# Patient Record
Sex: Female | Born: 1935 | Race: White | Hispanic: No | Marital: Married | Smoking: Never smoker
Health system: Southern US, Community
[De-identification: ages and names within clinical notes are randomized; demographics above are authoritative.]

## PROBLEM LIST (undated history)

## (undated) DIAGNOSIS — I639 Cerebral infarction, unspecified: Secondary | ICD-10-CM

## (undated) DIAGNOSIS — F419 Anxiety disorder, unspecified: Secondary | ICD-10-CM

## (undated) DIAGNOSIS — K219 Gastro-esophageal reflux disease without esophagitis: Secondary | ICD-10-CM

## (undated) DIAGNOSIS — I2510 Atherosclerotic heart disease of native coronary artery without angina pectoris: Secondary | ICD-10-CM

## (undated) DIAGNOSIS — M1990 Unspecified osteoarthritis, unspecified site: Secondary | ICD-10-CM

## (undated) DIAGNOSIS — E559 Vitamin D deficiency, unspecified: Secondary | ICD-10-CM

## (undated) DIAGNOSIS — G629 Polyneuropathy, unspecified: Secondary | ICD-10-CM

## (undated) DIAGNOSIS — E785 Hyperlipidemia, unspecified: Secondary | ICD-10-CM

## (undated) DIAGNOSIS — F329 Major depressive disorder, single episode, unspecified: Secondary | ICD-10-CM

## (undated) DIAGNOSIS — M797 Fibromyalgia: Secondary | ICD-10-CM

## (undated) DIAGNOSIS — I10 Essential (primary) hypertension: Secondary | ICD-10-CM

## (undated) DIAGNOSIS — I480 Paroxysmal atrial fibrillation: Secondary | ICD-10-CM

## (undated) DIAGNOSIS — I350 Nonrheumatic aortic (valve) stenosis: Secondary | ICD-10-CM

## (undated) DIAGNOSIS — F32A Depression, unspecified: Secondary | ICD-10-CM

## (undated) DIAGNOSIS — M519 Unspecified thoracic, thoracolumbar and lumbosacral intervertebral disc disorder: Secondary | ICD-10-CM

## (undated) HISTORY — DX: Vitamin D deficiency, unspecified: E55.9

## (undated) HISTORY — PX: ESOPHAGOGASTRODUODENOSCOPY ENDOSCOPY: SHX5814

## (undated) HISTORY — DX: Unspecified osteoarthritis, unspecified site: M19.90

## (undated) HISTORY — PX: CARDIAC CATHETERIZATION: SHX172

## (undated) HISTORY — DX: Hyperlipidemia, unspecified: E78.5

## (undated) HISTORY — DX: Essential (primary) hypertension: I10

## (undated) HISTORY — DX: Fibromyalgia: M79.7

## (undated) HISTORY — DX: Unspecified thoracic, thoracolumbar and lumbosacral intervertebral disc disorder: M51.9

## (undated) HISTORY — PX: CHOLECYSTECTOMY: SHX55

## (undated) HISTORY — PX: COLONOSCOPY: SHX174

## (undated) HISTORY — DX: Depression, unspecified: F32.A

## (undated) HISTORY — DX: Paroxysmal atrial fibrillation: I48.0

## (undated) HISTORY — PX: ABDOMINAL HYSTERECTOMY: SHX81

## (undated) HISTORY — DX: Polyneuropathy, unspecified: G62.9

## (undated) HISTORY — DX: Gastro-esophageal reflux disease without esophagitis: K21.9

## (undated) HISTORY — DX: Major depressive disorder, single episode, unspecified: F32.9

## (undated) HISTORY — PX: JOINT REPLACEMENT: SHX530

---

## 2014-07-28 ENCOUNTER — Non-Acute Institutional Stay (SKILLED_NURSING_FACILITY): Payer: Medicare Other | Admitting: Internal Medicine

## 2014-07-28 DIAGNOSIS — Z96651 Presence of right artificial knee joint: Secondary | ICD-10-CM

## 2014-07-28 DIAGNOSIS — E785 Hyperlipidemia, unspecified: Secondary | ICD-10-CM

## 2014-07-28 DIAGNOSIS — E559 Vitamin D deficiency, unspecified: Secondary | ICD-10-CM

## 2014-07-28 DIAGNOSIS — T8140XA Infection following a procedure, unspecified, initial encounter: Secondary | ICD-10-CM

## 2014-07-28 DIAGNOSIS — G629 Polyneuropathy, unspecified: Secondary | ICD-10-CM

## 2014-07-28 DIAGNOSIS — K219 Gastro-esophageal reflux disease without esophagitis: Secondary | ICD-10-CM

## 2014-07-28 DIAGNOSIS — I10 Essential (primary) hypertension: Secondary | ICD-10-CM

## 2014-07-28 DIAGNOSIS — F329 Major depressive disorder, single episode, unspecified: Secondary | ICD-10-CM

## 2014-07-28 DIAGNOSIS — F32A Depression, unspecified: Secondary | ICD-10-CM

## 2014-07-28 DIAGNOSIS — T814XXA Infection following a procedure, initial encounter: Secondary | ICD-10-CM

## 2014-07-28 NOTE — Progress Notes (Signed)
MRN: [id number] Name: Heather Walters  Sex: female Age: 80 y.o. DOB: 11-Mar-1932  [name] Facility/Room:114 Level Of Care: SNF Provider: Merrilee Seashore D Emergency Contacts: No emergency contact information on file.  Code Status: FULL  Allergies: Codeine and Lipitor  Chief Complaint  Patient presents with  . New Admit To SNF    HPI: Patient is 80 y.o. female who is admitted to SNF for OT/PT s/p R knee arthroplasy.  Past Medical History  Diagnosis Date  . Arthritis   . Fibromyalgia   . Intervertebral disk disease     lumbar  . Hypertension   . Depression   . Neuropathy   . GERD (gastroesophageal reflux disease)   . Hyperlipidemia   . Vitamin D deficiency     Past Surgical History  Procedure Laterality Date  . Cholecystectomy    . Abdominal hysterectomy        Medication List       This list is accurate as of: 07/28/14 11:59 PM.  Always use your most recent med list.               amLODipine 10 MG tablet  Commonly known as:  NORVASC  Take 10 mg by mouth daily.     aspirin 325 MG EC tablet  Take 325 mg by mouth 2 (two) times daily.     carvedilol 6.25 MG tablet  Commonly known as:  COREG  Take 6.25 mg by mouth 2 (two) times daily with a meal.     dicyclomine 10 MG capsule  Commonly known as:  BENTYL  Take 10 mg by mouth 3 (three) times daily before meals.     enalapril-hydrochlorothiazide 10-25 MG per tablet  Commonly known as:  VASERETIC  Take 1 tablet by mouth 2 (two) times daily.     escitalopram 20 MG tablet  Commonly known as:  LEXAPRO  Take 20 mg by mouth daily.     furosemide 40 MG tablet  Commonly known as:  LASIX  Take 40 mg by mouth daily as needed.     gabapentin 400 MG capsule  Commonly known as:  NEURONTIN  Take 400 mg by mouth 3 (three) times daily.     LORazepam 0.5 MG tablet  Commonly known as:  ATIVAN  Take 0.5 mg by mouth 2 (two) times daily.     Magnesium 250 MG Tabs  Take 1 tablet by mouth 2 (two) times  daily.     metoCLOPramide 10 MG tablet  Commonly known as:  REGLAN  Take 10 mg by mouth 2 (two) times daily.     oxyCODONE-acetaminophen 7.5-325 MG per tablet  Commonly known as:  PERCOCET  Take 1 tablet by mouth every 12 (twelve) hours as needed for pain.     pantoprazole 40 MG tablet  Commonly known as:  PROTONIX  Take 40 mg by mouth daily.     promethazine 25 MG tablet  Commonly known as:  PHENERGAN  Take 25 mg by mouth every 6 (six) hours as needed for nausea or vomiting.     rosuvastatin 20 MG tablet  Commonly known as:  CRESTOR  Take 20 mg by mouth daily.     sucralfate 1 G tablet  Commonly known as:  CARAFATE  Take 1 g by mouth 4 (four) times daily -  with meals and at bedtime.     terbinafine 250 MG tablet  Commonly known as:  LAMISIL  Take 250 mg by mouth daily. For 7  days every month     traZODone 50 MG tablet  Commonly known as:  DESYREL  Take 50 mg by mouth at bedtime.     Vitamin D (Ergocalciferol) 50000 UNITS Caps capsule  Commonly known as:  DRISDOL  Take 50,000 Units by mouth every 7 (seven) days.     zolpidem 10 MG tablet  Commonly known as:  AMBIEN  Take 10 mg by mouth at bedtime.        Meds ordered this encounter  Medications  . amLODipine (NORVASC) 10 MG tablet    Sig: Take 10 mg by mouth daily.  . carvedilol (COREG) 6.25 MG tablet    Sig: Take 6.25 mg by mouth 2 (two) times daily with a meal.  . dicyclomine (BENTYL) 10 MG capsule    Sig: Take 10 mg by mouth 3 (three) times daily before meals.  . enalapril-hydrochlorothiazide (VASERETIC) 10-25 MG per tablet    Sig: Take 1 tablet by mouth 2 (two) times daily.  . Vitamin D, Ergocalciferol, (DRISDOL) 50000 UNITS CAPS capsule    Sig: Take 50,000 Units by mouth every 7 (seven) days.  Marland Kitchen. escitalopram (LEXAPRO) 20 MG tablet    Sig: Take 20 mg by mouth daily.  . furosemide (LASIX) 40 MG tablet    Sig: Take 40 mg by mouth daily as needed.  . gabapentin (NEURONTIN) 400 MG capsule    Sig: Take  400 mg by mouth 3 (three) times daily.  Marland Kitchen. LORazepam (ATIVAN) 0.5 MG tablet    Sig: Take 0.5 mg by mouth 2 (two) times daily.  . Magnesium 250 MG TABS    Sig: Take 1 tablet by mouth 2 (two) times daily.  . metoCLOPramide (REGLAN) 10 MG tablet    Sig: Take 10 mg by mouth 2 (two) times daily.  Marland Kitchen. oxyCODONE-acetaminophen (PERCOCET) 7.5-325 MG per tablet    Sig: Take 1 tablet by mouth every 12 (twelve) hours as needed for pain.  . pantoprazole (PROTONIX) 40 MG tablet    Sig: Take 40 mg by mouth daily.  . promethazine (PHENERGAN) 25 MG tablet    Sig: Take 25 mg by mouth every 6 (six) hours as needed for nausea or vomiting.  . rosuvastatin (CRESTOR) 20 MG tablet    Sig: Take 20 mg by mouth daily.  . sucralfate (CARAFATE) 1 G tablet    Sig: Take 1 g by mouth 4 (four) times daily -  with meals and at bedtime.  . terbinafine (LAMISIL) 250 MG tablet    Sig: Take 250 mg by mouth daily. For 7 days every month  . traZODone (DESYREL) 50 MG tablet    Sig: Take 50 mg by mouth at bedtime.  Marland Kitchen. zolpidem (AMBIEN) 10 MG tablet    Sig: Take 10 mg by mouth at bedtime.  Marland Kitchen. aspirin 325 MG EC tablet    Sig: Take 325 mg by mouth 2 (two) times daily.     There is no immunization history on file for this patient.  History  Substance Use Topics  . Smoking status: Never Smoker   . Smokeless tobacco: Never Used  . Alcohol Use: No    Family history is noncontributory    Review of Systems  DATA OBTAINED: from patient GENERAL:  no fevers, fatigue, appetite changes SKIN: No itching, rash or wounds EYES: No eye pain, redness, discharge EARS: No earache, tinnitus, change in hearing NOSE: No congestion, drainage or bleeding  MOUTH/THROAT: No mouth or tooth pain, No sore throat RESPIRATORY: No cough,  wheezing, SOB CARDIAC: No chest pain, palpitations, lower extremity edema  GI: No abdominal pain, No N/V/D or constipation, No heartburn or reflux  GU: No dysuria, frequency or urgency, or incontinence   MUSCULOSKELETAL: pain in knee not worse recently NEUROLOGIC: No headache, dizziness or focal weakness PSYCHIATRIC: No overt anxiety or sadness, No behavior issue.   Filed Vitals:   07/28/14 2211  BP: 144/65  Pulse: 74  Temp: 96.7 F (35.9 C)  Resp: 18    Physical Exam  GENERAL APPEARANCE: Alert, conversant,  No acute distress.  SKIN: mild erythema with heat lateral side to knee incision;no drainage HEAD: Normocephalic, atraumatic  EYES: Conjunctiva/lids clear. Pupils round, reactive. EOMs intact.  EARS: External exam WNL, canals clear. Hearing grossly normal.  NOSE: No deformity or discharge.  MOUTH/THROAT: Lips w/o lesions  RESPIRATORY: Breathing is even, unlabored. Lung sounds are clear   CARDIOVASCULAR: Heart RRR no murmurs, rubs or gallops. Trace peripheral edema on R  GASTROINTESTINAL: Abdomen is soft, non-tender, not distended w/ normal bowel sounds. GENITOURINARY: Bladder non tender, not distended  MUSCULOSKELETAL: R knee arthroplasty with central incision NEUROLOGIC:  Cranial nerves 2-12 grossly intact. Moves all extremities  PSYCHIATRIC: Mood and affect appropriate to situation, no behavioral issues  Patient Active Problem List   Diagnosis Date Noted  . S/P total knee arthroplasty 07/29/2014  . Post-operative infection 07/29/2014  . Hypertension   . Depression   . Neuropathy   . GERD (gastroesophageal reflux disease)   . Hyperlipidemia   . Vitamin D deficiency        Assessment and Plan  S/P total knee arthroplasty For endstage OA; ASA 325 BID as prophylaxis; admitted to SNF for OT/PT  Hypertension Pt on multiple med-cont all  GERD (gastroesophageal reflux disease) Continue protonix,carafate and reglan  Neuropathy Neurontin 400 mg TID to continue  Depression Continue lexapro, ativan, trazodone  Hyperlipidemia Continue crestor 20 mg  Vitamin D deficiency Repletion 50,000 u weekly  Post-operative infection Early cellulitis-start doxycycline  100 mg BID for 7 days    Margit HanksALEXANDER, ANNE D, MD

## 2014-07-29 ENCOUNTER — Encounter: Payer: Self-pay | Admitting: Internal Medicine

## 2014-07-29 DIAGNOSIS — Z96659 Presence of unspecified artificial knee joint: Secondary | ICD-10-CM | POA: Insufficient documentation

## 2014-07-29 DIAGNOSIS — E559 Vitamin D deficiency, unspecified: Secondary | ICD-10-CM | POA: Insufficient documentation

## 2014-07-29 DIAGNOSIS — I10 Essential (primary) hypertension: Secondary | ICD-10-CM | POA: Insufficient documentation

## 2014-07-29 DIAGNOSIS — F32A Depression, unspecified: Secondary | ICD-10-CM | POA: Insufficient documentation

## 2014-07-29 DIAGNOSIS — E785 Hyperlipidemia, unspecified: Secondary | ICD-10-CM | POA: Insufficient documentation

## 2014-07-29 DIAGNOSIS — T8140XA Infection following a procedure, unspecified, initial encounter: Secondary | ICD-10-CM | POA: Insufficient documentation

## 2014-07-29 DIAGNOSIS — G629 Polyneuropathy, unspecified: Secondary | ICD-10-CM | POA: Insufficient documentation

## 2014-07-29 DIAGNOSIS — K219 Gastro-esophageal reflux disease without esophagitis: Secondary | ICD-10-CM | POA: Insufficient documentation

## 2014-07-29 DIAGNOSIS — F329 Major depressive disorder, single episode, unspecified: Secondary | ICD-10-CM | POA: Insufficient documentation

## 2014-07-29 NOTE — Assessment & Plan Note (Signed)
Early cellulitis-start doxycycline 100 mg BID for 7 days

## 2014-07-29 NOTE — Assessment & Plan Note (Signed)
Continue protonix,carafate and reglan

## 2014-07-29 NOTE — Assessment & Plan Note (Signed)
Pt on multiple med-cont all

## 2014-07-29 NOTE — Assessment & Plan Note (Signed)
Neurontin 400 mg TID to continue

## 2014-07-29 NOTE — Assessment & Plan Note (Signed)
For endstage OA; ASA 325 BID as prophylaxis; admitted to SNF for OT/PT

## 2014-07-29 NOTE — Assessment & Plan Note (Signed)
Repletion 50,000 u weekly

## 2014-07-29 NOTE — Assessment & Plan Note (Signed)
Continue crestor 20 mg 

## 2014-07-29 NOTE — Assessment & Plan Note (Signed)
Continue lexapro, ativan, trazodone

## 2014-08-03 ENCOUNTER — Non-Acute Institutional Stay (SKILLED_NURSING_FACILITY): Payer: Medicare Other | Admitting: Internal Medicine

## 2014-08-03 DIAGNOSIS — T814XXA Infection following a procedure, initial encounter: Secondary | ICD-10-CM

## 2014-08-03 DIAGNOSIS — T8140XA Infection following a procedure, unspecified, initial encounter: Secondary | ICD-10-CM

## 2014-08-03 DIAGNOSIS — I10 Essential (primary) hypertension: Secondary | ICD-10-CM

## 2014-08-03 DIAGNOSIS — Z96651 Presence of right artificial knee joint: Secondary | ICD-10-CM

## 2014-08-03 DIAGNOSIS — D72829 Elevated white blood cell count, unspecified: Secondary | ICD-10-CM

## 2014-08-03 NOTE — Progress Notes (Signed)
Patient ID: Heather MiloAthalee Bottcher, female   DOB: 1932/02/16, 80 y.o.   MRN: [id number] MRN: [id number] Name: Heather Walters  Sex: female Age: 80 y.o. DOB: 1932/02/16  [facility] Facility/Room:114 Level Of Care: SNF Provider: Roena MaladyLASSEN, Amandalee Lacap C Emergency Contacts: Extended Emergency Contact Information Primary Emergency Contact: Bembenek,Charles Address: [address]          [place], [place] [id number] [place] Mobile Phone: [phone] Relation: Spouse Secondary Emergency Contact: Kizzie IdeFowlwell,Amy          [place] Mobile Phone: [phone] Relation: Daughter  Code Status: FULL  Allergies: Codeine and Lipitor  Chief Complaint  Patient presents with  . Acute Visit  follow-up right knee infection--leukocytosis--  HPI: Patient is 80 y.o. female who is admitted to SNF for OT/PT s/p R knee arthroplasy.--she appears to be doing relatively well-she was seen by Dr. Lyn HollingsheadAlexander last week who did start her on doxycycline for concerns of a developing surgical site infection of the right knee-she actually was seen by orthopedics earlier today and apparently was thought to be doing well.  She has been afebrile.  I do not she has received updated labs on December 3 which shows a white count of 13.3-hemoglobin of 9.6---platelets of 372.  I do not have any prior labs available for comparison since [facility]-however clinically appears to be stable in this regards.    Past Medical History  Diagnosis Date  . Arthritis   . Fibromyalgia   . Intervertebral disk disease     lumbar  . Hypertension   . Depression   . Neuropathy   . GERD (gastroesophageal reflux disease)   . Hyperlipidemia   . Vitamin D deficiency     Past Surgical History  Procedure Laterality Date  . Cholecystectomy    . Abdominal hysterectomy        Medication List       This list is accurate as of: 08/03/14 11:59 PM.  Always use your most recent med list.                amLODipine 10 MG tablet  Commonly known as:  NORVASC  Take 10 mg by mouth daily.     aspirin 325 MG EC tablet  Take 325 mg by mouth 2 (two) times daily.     carvedilol 6.25 MG tablet  Commonly known as:  COREG  Take 6.25 mg by mouth 2 (two) times daily with a meal.     dicyclomine 10 MG capsule  Commonly known as:  BENTYL  Take 10 mg by mouth 3 (three) times daily before meals.     enalapril-hydrochlorothiazide 10-25 MG per tablet  Commonly known as:  VASERETIC  Take 1 tablet by mouth 2 (two) times daily.     escitalopram 20 MG tablet  Commonly known as:  LEXAPRO  Take 20 mg by mouth daily.     furosemide 40 MG tablet  Commonly known as:  LASIX  Take 40 mg by mouth daily as needed.     gabapentin 400 MG capsule  Commonly known as:  NEURONTIN  Take 400 mg by mouth 3 (three) times daily.     LORazepam 0.5 MG tablet  Commonly known as:  ATIVAN  Take 0.5 mg by mouth 2 (two) times daily.     Magnesium 250 MG Tabs  Take 1 tablet by mouth 2 (two) times daily.     metoCLOPramide 10 MG tablet  Commonly  known as:  REGLAN  Take 10 mg by mouth 2 (two) times daily.     oxyCODONE-acetaminophen 7.5-325 MG per tablet  Commonly known as:  PERCOCET  Take 1 tablet by mouth every 12 (twelve) hours as needed for pain.     pantoprazole 40 MG tablet  Commonly known as:  PROTONIX  Take 40 mg by mouth daily.     promethazine 25 MG tablet  Commonly known as:  PHENERGAN  Take 25 mg by mouth every 6 (six) hours as needed for nausea or vomiting.     rosuvastatin 20 MG tablet  Commonly known as:  CRESTOR  Take 20 mg by mouth daily.     sucralfate 1 G tablet  Commonly known as:  CARAFATE  Take 1 g by mouth 4 (four) times daily -  with meals and at bedtime.     terbinafine 250 MG tablet  Commonly known as:  LAMISIL  Take 250 mg by mouth daily. For 7 days every month     traZODone 50 MG tablet  Commonly known as:  DESYREL  Take 50 mg by mouth at bedtime.     Vitamin  D (Ergocalciferol) 50000 UNITS Caps capsule  Commonly known as:  DRISDOL  Take 50,000 Units by mouth every 7 (seven) days.     zolpidem 10 MG tablet  Commonly known as:  AMBIEN  Take 10 mg by mouth at bedtime.        No orders of the defined types were placed in this encounter.     There is no immunization history on file for this patient.  History  Substance Use Topics  . Smoking status: Never Smoker   . Smokeless tobacco: Never Used  . Alcohol Use: No    Family history is noncontributory    Review of Systems  DATA OBTAINED: from patient GENERAL:  no fevers, fatigue, appetite changes SKIN: No itching, rash or wounds EYES: No eye pain, redness, discharge EARS: No earache, tinnitus, change in hearing NOSE: No congestion, drainage or bleeding  MOUTH/THROAT: No mouth or tooth pain, No sore throat RESPIRATORY: No cough, wheezing, SOB CARDIAC: No chest pain, palpitations, lower extremity edema  GI: No abdominal pain, No N/V/D or constipation, has history of GERD reflux on multiple agents  GU: No dysuria, frequency or urgency, or incontinence  MUSCULOSKELETAL: pain in knee not worse recently--had some concerns of infection of her right knee earlier in her stay but this appears to have resolved fairly unremarkably NEUROLOGIC: No headache, dizziness or focal weakness PSYCHIATRIC: No overt anxiety or sadness, No behavior issue.   Filed Vitals:   08/05/14 1523  BP: 130/74  Pulse: 70  Temp: 98 F (36.7 C)  Resp: 18    Physical Exam  GENERAL APPEARANCE: Alert, conversant,  No acute distress.  SKIN: apparently the erythema lateral to the right knee surgical site has resolved-this does not appear to be remarkable today Steri-Strips are in place--there is no drainage or bleeding HEAD: Normocephalic, atraumatic  EYES: Conjunctiva/lids clear. Pupils round, reactive. EOMs intact.  EARS: External exam WNL, canals clear. Hearing grossly normal.  NOSE: No deformity or discharge.   MOUTH/THROAT: Lips w/o lesions  RESPIRATORY: Breathing is even, unlabored. Lung sounds are clear   CARDIOVASCULAR: Heart RRR no murmurs, rubs or gallops. Trace peripheral edema on R  GASTROINTESTINAL: Abdomen is soft, non-tender, not distended w/ normal bowel sounds. GENITOURINARY: Bladder non tender, not distended  MUSCULOSKELETAL: R knee arthroplasty with central incision  asnoted above NEUROLOGIC:  Cranial nerves 2-12 grossly intact. Moves all extremities  PSYCHIATRIC: Mood and affect appropriate to situation, no behavioral issues  Patient Active Problem List   Diagnosis Date Noted  . S/P total knee arthroplasty 07/29/2014  . Post-operative infection 07/29/2014  . Hypertension   . Depression   . Neuropathy   . GERD (gastroesophageal reflux disease)   . Hyperlipidemia   . Vitamin D deficiency        Assessment and Plan   #1-right knee surgical infection-this appears largely resolved she is finishing a course of doxycycline at this point monitor but do not really note any signs of infection currently--she continues on aspirin for anticoagulation status post surgery-is receiving oxycodone for pain management . #2 leukocytosis--have ordered a CBC for follow-up-do not have basis of comparison from previous lab although this could be reactive from surgery---currently not showing any signs of overt infection does not complain of dysuria shortness of breath or chest congestion or cough--again will see what the updated lab work tells [place].  #3 mild hyponatremia I do note she is on Lasix again this is very minimal will update lab for follow-up.  History hypertension she is on multiple agents-this appears stable recent blood pressures 135/85-130/74-139/79 at this point will monitor.  #4-neuropathy does not complain of neuropathic pain currently she is on Neurontin and continue to monitor  .    No problem-specific assessment & plan notes found for this encounter.   Brooklyn Jeff C,

## 2014-08-05 ENCOUNTER — Encounter: Payer: Self-pay | Admitting: Internal Medicine

## 2014-08-06 ENCOUNTER — Other Ambulatory Visit: Payer: Self-pay | Admitting: *Deleted

## 2014-08-06 MED ORDER — OXYCODONE-ACETAMINOPHEN 7.5-325 MG PO TABS: ORAL_TABLET | ORAL | Status: DC

## 2014-08-06 NOTE — Telephone Encounter (Signed)
[facility] 

## 2014-08-07 ENCOUNTER — Non-Acute Institutional Stay (SKILLED_NURSING_FACILITY): Payer: Medicare Other | Admitting: Internal Medicine

## 2014-08-07 DIAGNOSIS — G629 Polyneuropathy, unspecified: Secondary | ICD-10-CM

## 2014-08-07 DIAGNOSIS — T8140XA Infection following a procedure, unspecified, initial encounter: Secondary | ICD-10-CM

## 2014-08-07 DIAGNOSIS — I10 Essential (primary) hypertension: Secondary | ICD-10-CM

## 2014-08-07 DIAGNOSIS — K219 Gastro-esophageal reflux disease without esophagitis: Secondary | ICD-10-CM

## 2014-08-07 DIAGNOSIS — Z96659 Presence of unspecified artificial knee joint: Secondary | ICD-10-CM

## 2014-08-07 DIAGNOSIS — T814XXA Infection following a procedure, initial encounter: Secondary | ICD-10-CM

## 2014-08-07 DIAGNOSIS — R609 Edema, unspecified: Secondary | ICD-10-CM

## 2014-08-07 NOTE — Progress Notes (Signed)
Patient ID: [name], female   DOB: 03/19/1932, 80 y.o.   MRN: [id number] MRN: [id number] Name: Heather Walters  Sex: female Age: 80 y.o. DOB: 18-Mar-1932  [facility] #: [name] Facility/Room:114 Level Of Care: SNF Provider: Roena Malady Emergency Contacts: Extended Emergency Contact Information Primary Emergency Contact: Matlack,Charles Address: [address]          [place], [place] [address] [place] of [place] Phone: [phone] Relation: Spouse Secondary Emergency Contact: Kizzie Ide, [place] of [place] Phone: [phone] Relation: Daughter  Code Status: FULL  Allergies: Codeine and Lipitor  Chief Complaint  Patient presents with  . Discharge Note    HPI: Patient is 80 y.o. female who is admitted to SNF for OT/PT s/p R knee arthroplasy.--She has been relatively well-although still has significant ambulation issue she does use a rolling walker-she will need continued physical therapy and occupational therapy at home secondary to this weakness as well as nursing support-as well as a rolling walker.  She does have a husband who is very supportive-but will need extensive support nonetheless-she she still is quite weak  She recently completed course of doxycycline for a mild postop infection at the surgical site this appears to have resolved.  She does have a significant history of GERD which occasionally symptomatic she is on Carafate and Reglan as well as a proton pump inhibitor.  Vital signs remainstable she has been afebrile    Past Medical History  Diagnosis Date  . Arthritis   . Fibromyalgia   . Intervertebral disk disease     lumbar  . Hypertension   . Depression   . Neuropathy   . GERD (gastroesophageal reflux disease)   . Hyperlipidemia   . Vitamin D deficiency     Past Surgical History  Procedure Laterality Date  . Cholecystectomy    . Abdominal hysterectomy        Medication List       This list is  accurate as of: 08/07/14 11:59 PM.  Always use your most recent med list.               amLODipine 10 MG tablet  Commonly known as:  NORVASC  Take 10 mg by mouth daily.     aspirin 325 MG EC tablet  Take 325 mg by mouth 2 (two) times daily.     carvedilol 6.25 MG tablet  Commonly known as:  COREG  Take 6.25 mg by mouth 2 (two) times daily with a meal.     dicyclomine 10 MG capsule  Commonly known as:  BENTYL  Take 10 mg by mouth 3 (three) times daily before meals.     enalapril-hydrochlorothiazide 10-25 MG per tablet  Commonly known as:  VASERETIC  Take 1 tablet by mouth 2 (two) times daily.     escitalopram 20 MG tablet  Commonly known as:  LEXAPRO  Take 20 mg by mouth daily.     furosemide 40 MG tablet  Commonly known as:  LASIX  Take 40 mg by mouth daily as needed.     gabapentin 400 MG capsule  Commonly known as:  NEURONTIN  Take 400 mg by mouth 3 (three) times daily.     LORazepam 0.5 MG tablet  Commonly known as:  ATIVAN  Take 0.5 mg by mouth 2 (two) times daily.     Magnesium 250 MG Tabs  Take 1 tablet by mouth 2 (two) times daily.  metoCLOPramide 10 MG tablet  Commonly known as:  REGLAN  Take 10 mg by mouth 2 (two) times daily.     oxyCODONE-acetaminophen 7.5-325 MG per tablet  Commonly known as:  PERCOCET  Take one or two tablets by mouth every 4-6 hours as needed for pain     pantoprazole 40 MG tablet  Commonly known as:  PROTONIX  Take 40 mg by mouth daily.     promethazine 25 MG tablet  Commonly known as:  PHENERGAN  Take 25 mg by mouth every 6 (six) hours as needed for nausea or vomiting.     rosuvastatin 20 MG tablet  Commonly known as:  CRESTOR  Take 20 mg by mouth daily.     sucralfate 1 G tablet  Commonly known as:  CARAFATE  Take 1 g by mouth 4 (four) times daily -  with meals and at bedtime.     terbinafine 250 MG tablet  Commonly known as:  LAMISIL  Take 250 mg by mouth daily. For 7 days every month     traZODone 50 MG  tablet  Commonly known as:  DESYREL  Take 50 mg by mouth at bedtime.     Vitamin D (Ergocalciferol) 50000 UNITS Caps capsule  Commonly known as:  DRISDOL  Take 50,000 Units by mouth every 7 (seven) days.     zolpidem 10 MG tablet  Commonly known as:  AMBIEN  Take 10 mg by mouth at bedtime.        No orders of the defined types were placed in this encounter.     There is no immunization history on file for this patient.  History  Substance Use Topics  . Smoking status: Never Smoker   . Smokeless tobacco: Never Used  . Alcohol Use: No    Family history is noncontributory    Review of Systems  DATA OBTAINED: from patient GENERAL:  no fevers, fatigue, appetite changes--says occasionally she'll feel bit bloated SKIN: No itching, rash or wounds-school site right knee appears improved status post antibiotic for infection EYES: No eye pain, redness, discharge EARS: No earache, tinnitus, change in hearing NOSE: No congestion, drainage or bleeding  MOUTH/THROAT: No mouth or tooth pain, No sore throat RESPIRATORY: No cough, wheezing, SOB CARDIAC: No chest pain, palpitations  has some mildly increased, lower extremity edema  GI: No abdominal pain, No N/V/D or constipation, as reflux occasionally symptomatic GU: No dysuria, frequency or urgency, or incontinence  MUSCULOSKELETAL: pain in knee not worse recently NEUROLOGIC: No headache, dizziness or focal weakness PSYCHIATRIC: No overt anxiety or sadness, No behavior issue.   Filed Vitals:   08/09/14 2202  BP: 135/65  Pulse: 70  Temp: 96.2 F (35.7 C)  Resp: 20    Physical Exam  GENERAL APPEARANCE: Alert, conversant,  No acute distress.  SKIN: Warm and dry s  right knee surgical site appears stable I do not see any sig--nificant concerning erythema this is improved I do not see sign of infection HEAD: Normocephalic, atraumatic  EYES: Conjunctiva/lids clear. Pupils round, reactive. EOMs intact.  EARS: External exam WNL,  canals clear. Hearing grossly normal.  NOSE: No deformity or discharge.  MOUTH/THROAT: Lips w/o lesions  RESPIRATORY: Breathing is even, unlabored. Lung sounds are clear   CARDIOVASCULAR: Heart RRR no murmurs, rubs or gallops. Trace left lower extremity peripheral edema --1+ edema on the right this is cool nonerythematous nontender with palpable pedal pulse although somewhat reduced  GASTROINTESTINAL: Abdomen is soft, non-tender, obese w/ normal bowel  sounds. GENITOURINARY: Bladder non tender, not distended  MUSCULOSKELETAL: R knee arthroplasty with central incision again site appears improved--she is able to ambulate with a rolling walker but has significant weakness when trying to rise from a seated position NEUROLOGIC:  Cranial nerves 2-12 grossly intact. Moves all extremities  PSYCHIATRIC: Mood and affect appropriate to situation, no behavioral issues  Patient Active Problem List   Diagnosis Date Noted  . S/P total knee arthroplasty 07/29/2014  . Post-operative infection 07/29/2014  . Hypertension   . Depression   . Neuropathy   . GERD (gastroesophageal reflux disease)   . Hyperlipidemia   . Vitamin D deficiency    labs.  08/03/2014.  Sodium 139 potassium 5 BUN 42 creatinine 0.9.  Abdomen phosphatase 131 otherwise liver function tests within normal limits  WBC 11.0 this is actually an improvement-hemoglobin 9.5-platelets 374     Assessment and Plan  #1-history of status post right knee replacement-at this point appears stable she has seen orthopedics-postop infection appears resolved-she is receiving Percocet as needed for pain-continues DVT prophylaxis with aspirin 325 twice a day-she will need extensive PT and OT secondary to continued weakness as well as nursing support.  #2-hypertension this appears stable on numerous agents including Coreg and enalapril hydrochlorothiazide---  #3-edema-this has increased somewhat she says when this happened she usually takes when  necessary Lasix-she says this is usually a accompanied by bloating feeling that she has at times-will give her Lasix 40 mg one time dose-also obtain a chest x-ray tomorrow before discharge to keep an eye on this clinically she does not appear to be unstable certainly  #4-GERD-she is on Carafate Reglan and Protonix this continues or occasionally be symptomatic but appears relatively controlled-defer follow-up to primary care provider.  #5-neuropathy she continues on Neurontin.  #6-history of hyperlipidemia she continues on Crestor.  #7-history depression-this appears relatively stable on Lexapro.  #8-history of anxiety she continues on trazodone-for insomnia she continues on Ambien.  At this point patient continues to be weak again will need extensive therapy-again there are some insurance issues here which have expediated the discharge-nonetheless she does have a very supportive husband--and will have home health which certainly is necessary-also will need a rolling walker.  [id number]-of note greater than 30 minutes spent on this discharge summary scripts have been written  No problem-specific assessment & plan notes found for this encounter.   Daryl Quiros C,

## 2014-08-09 ENCOUNTER — Encounter: Payer: Self-pay | Admitting: Internal Medicine

## 2016-08-28 DIAGNOSIS — I350 Nonrheumatic aortic (valve) stenosis: Secondary | ICD-10-CM

## 2016-08-28 HISTORY — DX: Nonrheumatic aortic (valve) stenosis: I35.0

## 2016-08-30 ENCOUNTER — Inpatient Hospital Stay (HOSPITAL_COMMUNITY)
Admission: EM | Admit: 2016-08-30 | Discharge: 2016-09-02 | DRG: 291 | Disposition: A | Discharge status: Home Health | Payer: Medicare HMO | Attending: Internal Medicine | Admitting: Internal Medicine

## 2016-08-30 ENCOUNTER — Ambulatory Visit (HOSPITAL_COMMUNITY)
Admit: 2016-08-30 | Discharge: 2016-08-30 | Disposition: A | Discharge status: Home / Self Care | Payer: Medicare HMO | Attending: Internal Medicine | Admitting: Internal Medicine

## 2016-08-30 ENCOUNTER — Emergency Department (HOSPITAL_COMMUNITY): Payer: Medicare HMO

## 2016-08-30 ENCOUNTER — Encounter (HOSPITAL_COMMUNITY): Payer: Self-pay | Admitting: Emergency Medicine

## 2016-08-30 DIAGNOSIS — I509 Heart failure, unspecified: Secondary | ICD-10-CM | POA: Diagnosis not present

## 2016-08-30 DIAGNOSIS — E785 Hyperlipidemia, unspecified: Secondary | ICD-10-CM | POA: Diagnosis present

## 2016-08-30 DIAGNOSIS — M797 Fibromyalgia: Secondary | ICD-10-CM | POA: Diagnosis present

## 2016-08-30 DIAGNOSIS — Z8701 Personal history of pneumonia (recurrent): Secondary | ICD-10-CM | POA: Diagnosis not present

## 2016-08-30 DIAGNOSIS — Z8673 Personal history of transient ischemic attack (TIA), and cerebral infarction without residual deficits: Secondary | ICD-10-CM | POA: Diagnosis not present

## 2016-08-30 DIAGNOSIS — J189 Pneumonia, unspecified organism: Secondary | ICD-10-CM | POA: Diagnosis present

## 2016-08-30 DIAGNOSIS — I10 Essential (primary) hypertension: Secondary | ICD-10-CM | POA: Diagnosis not present

## 2016-08-30 DIAGNOSIS — R509 Fever, unspecified: Secondary | ICD-10-CM

## 2016-08-30 DIAGNOSIS — G8929 Other chronic pain: Secondary | ICD-10-CM | POA: Diagnosis present

## 2016-08-30 DIAGNOSIS — K219 Gastro-esophageal reflux disease without esophagitis: Secondary | ICD-10-CM | POA: Diagnosis present

## 2016-08-30 DIAGNOSIS — K649 Unspecified hemorrhoids: Secondary | ICD-10-CM | POA: Diagnosis present

## 2016-08-30 DIAGNOSIS — F419 Anxiety disorder, unspecified: Secondary | ICD-10-CM | POA: Diagnosis present

## 2016-08-30 DIAGNOSIS — Z7982 Long term (current) use of aspirin: Secondary | ICD-10-CM | POA: Diagnosis not present

## 2016-08-30 DIAGNOSIS — R5381 Other malaise: Secondary | ICD-10-CM

## 2016-08-30 DIAGNOSIS — I5033 Acute on chronic diastolic (congestive) heart failure: Secondary | ICD-10-CM | POA: Diagnosis present

## 2016-08-30 DIAGNOSIS — Z90710 Acquired absence of both cervix and uterus: Secondary | ICD-10-CM | POA: Diagnosis not present

## 2016-08-30 DIAGNOSIS — M79609 Pain in unspecified limb: Secondary | ICD-10-CM

## 2016-08-30 DIAGNOSIS — R0902 Hypoxemia: Secondary | ICD-10-CM | POA: Diagnosis present

## 2016-08-30 DIAGNOSIS — R531 Weakness: Secondary | ICD-10-CM | POA: Diagnosis present

## 2016-08-30 DIAGNOSIS — K921 Melena: Secondary | ICD-10-CM | POA: Diagnosis not present

## 2016-08-30 DIAGNOSIS — I110 Hypertensive heart disease with heart failure: Principal | ICD-10-CM | POA: Diagnosis present

## 2016-08-30 DIAGNOSIS — R609 Edema, unspecified: Secondary | ICD-10-CM | POA: Diagnosis not present

## 2016-08-30 DIAGNOSIS — R5383 Other fatigue: Secondary | ICD-10-CM

## 2016-08-30 HISTORY — DX: Anxiety disorder, unspecified: F41.9

## 2016-08-30 HISTORY — DX: Nonrheumatic aortic (valve) stenosis: I35.0

## 2016-08-30 HISTORY — DX: Cerebral infarction, unspecified: I63.9

## 2016-08-30 LAB — COMPREHENSIVE METABOLIC PANEL
ALK PHOS: 80 U/L (ref 38–126)
ALT: 14 U/L (ref 14–54)
AST: 18 U/L (ref 15–41)
Albumin: 3.1 g/dL — ABNORMAL LOW (ref 3.5–5.0)
Anion gap: 10 (ref 5–15)
BILIRUBIN TOTAL: 0.4 mg/dL (ref 0.3–1.2)
BUN: 30 mg/dL — AB (ref 6–20)
CALCIUM: 9.4 mg/dL (ref 8.9–10.3)
CHLORIDE: 97 mmol/L — AB (ref 101–111)
CO2: 27 mmol/L (ref 22–32)
CREATININE: 1.04 mg/dL — AB (ref 0.44–1.00)
GFR calc Af Amer: 56 mL/min — ABNORMAL LOW (ref >60)
GFR, EST NON AFRICAN AMERICAN: 49 mL/min — AB (ref >60)
Glucose, Bld: 118 mg/dL — ABNORMAL HIGH (ref 65–99)
Potassium: 4 mmol/L (ref 3.5–5.1)
Sodium: 134 mmol/L — ABNORMAL LOW (ref 135–145)
TOTAL PROTEIN: 5.9 g/dL — AB (ref 6.5–8.1)

## 2016-08-30 LAB — CBC WITH DIFFERENTIAL/PLATELET
BASOS ABS: 0 10*3/uL (ref 0.0–0.1)
Basophils Relative: 0 %
Eosinophils Absolute: 0 10*3/uL (ref 0.0–0.7)
Eosinophils Relative: 0 %
HEMATOCRIT: 33.4 % — AB (ref 36.0–46.0)
HEMOGLOBIN: 11 g/dL — AB (ref 12.0–15.0)
LYMPHS ABS: 2.8 10*3/uL (ref 0.7–4.0)
LYMPHS PCT: 12 %
MCH: 30.9 pg (ref 26.0–34.0)
MCHC: 32.9 g/dL (ref 30.0–36.0)
MCV: 93.8 fL (ref 78.0–100.0)
Monocytes Absolute: 1.2 10*3/uL — ABNORMAL HIGH (ref 0.1–1.0)
Monocytes Relative: 5 %
NEUTROS ABS: 19.7 10*3/uL — AB (ref 1.7–7.7)
Neutrophils Relative %: 83 %
Platelets: 228 10*3/uL (ref 150–400)
RBC: 3.56 MIL/uL — AB (ref 3.87–5.11)
RDW: 13.2 % (ref 11.5–15.5)
WBC: 23.8 10*3/uL — AB (ref 4.0–10.5)

## 2016-08-30 LAB — URINALYSIS, ROUTINE W REFLEX MICROSCOPIC
Bilirubin Urine: NEGATIVE
GLUCOSE, UA: NEGATIVE mg/dL
HGB URINE DIPSTICK: NEGATIVE
KETONES UR: NEGATIVE mg/dL
LEUKOCYTES UA: NEGATIVE
Nitrite: NEGATIVE
PH: 5 (ref 5.0–8.0)
Protein, ur: NEGATIVE mg/dL
Specific Gravity, Urine: 1.011 (ref 1.005–1.030)

## 2016-08-30 LAB — BRAIN NATRIURETIC PEPTIDE: B Natriuretic Peptide: 191.8 pg/mL — ABNORMAL HIGH (ref 0.0–100.0)

## 2016-08-30 LAB — INFLUENZA PANEL BY PCR (TYPE A & B)
INFLAPCR: NEGATIVE
INFLBPCR: NEGATIVE

## 2016-08-30 LAB — TROPONIN I
Troponin I: 0.03 ng/mL (ref <0.03)
Troponin I: 0.03 ng/mL (ref <0.03)
Troponin I: 0.03 ng/mL (ref <0.03)

## 2016-08-30 LAB — LACTIC ACID, PLASMA
Lactic Acid, Venous: 1.1 mmol/L (ref 0.5–1.9)
Lactic Acid, Venous: 1.1 mmol/L (ref 0.5–1.9)

## 2016-08-30 LAB — TSH: TSH: 1.323 u[IU]/mL (ref 0.350–4.500)

## 2016-08-30 LAB — MRSA PCR SCREENING: MRSA BY PCR: NEGATIVE

## 2016-08-30 LAB — PROCALCITONIN: PROCALCITONIN: 3.51 ng/mL

## 2016-08-30 MED ORDER — SODIUM CHLORIDE 0.9% FLUSH
3.0000 mL | Freq: Two times a day (BID) | INTRAVENOUS | Status: DC
  Administered 2016-08-30 – 2016-09-01 (×5): 3 mL via INTRAVENOUS

## 2016-08-30 MED ORDER — ISOSORBIDE MONONITRATE ER 30 MG PO TB24
60.0000 mg | ORAL_TABLET | Freq: Every day | ORAL | Status: DC
  Administered 2016-08-30 – 2016-09-02 (×4): 60 mg via ORAL
  Filled 2016-08-30 (×3): qty 2
  Filled 2016-08-30: qty 1

## 2016-08-30 MED ORDER — ENALAPRIL MALEATE 10 MG PO TABS
10.0000 mg | ORAL_TABLET | Freq: Every day | ORAL | Status: DC
  Administered 2016-08-30 – 2016-08-31 (×2): 10 mg via ORAL
  Filled 2016-08-30 (×3): qty 1

## 2016-08-30 MED ORDER — ACETAMINOPHEN 500 MG PO TABS: 1000.0000 mg | ORAL_TABLET | Freq: Four times a day (QID) | ORAL | Status: DC | PRN

## 2016-08-30 MED ORDER — SODIUM CHLORIDE 0.9 % IV BOLUS (SEPSIS)
500.0000 mL | Freq: Once | INTRAVENOUS | Status: AC
  Administered 2016-08-30: 500 mL via INTRAVENOUS

## 2016-08-30 MED ORDER — OXYBUTYNIN CHLORIDE ER 5 MG PO TB24
10.0000 mg | ORAL_TABLET | Freq: Every day | ORAL | Status: DC
  Administered 2016-08-30 – 2016-09-01 (×3): 10 mg via ORAL
  Filled 2016-08-30 (×3): qty 2

## 2016-08-30 MED ORDER — DEXTROSE 5 % IV SOLN
1.0000 g | Freq: Two times a day (BID) | INTRAVENOUS | Status: DC
  Administered 2016-08-30 – 2016-09-02 (×6): 1 g via INTRAVENOUS
  Filled 2016-08-30 (×6): qty 1

## 2016-08-30 MED ORDER — SODIUM CHLORIDE 0.9 % IV SOLN: 250.0000 mL | INTRAVENOUS | Status: DC | PRN

## 2016-08-30 MED ORDER — TRAZODONE HCL 50 MG PO TABS
50.0000 mg | ORAL_TABLET | Freq: Every day | ORAL | Status: DC
  Administered 2016-08-30 – 2016-09-01 (×3): 50 mg via ORAL
  Filled 2016-08-30 (×3): qty 1

## 2016-08-30 MED ORDER — ACETAMINOPHEN 325 MG PO TABS
650.0000 mg | ORAL_TABLET | ORAL | Status: DC | PRN
  Administered 2016-09-01 – 2016-09-02 (×2): 650 mg via ORAL
  Filled 2016-08-30 (×3): qty 2

## 2016-08-30 MED ORDER — ONDANSETRON HCL 4 MG/2ML IJ SOLN
4.0000 mg | Freq: Four times a day (QID) | INTRAMUSCULAR | Status: DC | PRN
  Administered 2016-08-31: 4 mg via INTRAVENOUS
  Filled 2016-08-30: qty 2

## 2016-08-30 MED ORDER — VANCOMYCIN HCL IN DEXTROSE 750-5 MG/150ML-% IV SOLN
750.0000 mg | Freq: Two times a day (BID) | INTRAVENOUS | Status: DC
  Administered 2016-08-30 – 2016-08-31 (×3): 750 mg via INTRAVENOUS
  Filled 2016-08-30 (×4): qty 150

## 2016-08-30 MED ORDER — PENTAZOCINE-NALOXONE HCL 50-0.5 MG PO TABS
1.0000 | ORAL_TABLET | Freq: Four times a day (QID) | ORAL | Status: DC | PRN
  Administered 2016-08-31 – 2016-09-02 (×3): 1 via ORAL
  Filled 2016-08-30 (×3): qty 1

## 2016-08-30 MED ORDER — ESCITALOPRAM OXALATE 20 MG PO TABS
20.0000 mg | ORAL_TABLET | Freq: Every day | ORAL | Status: DC
  Administered 2016-08-30 – 2016-09-02 (×4): 20 mg via ORAL
  Filled 2016-08-30: qty 2
  Filled 2016-08-30 (×3): qty 1

## 2016-08-30 MED ORDER — SODIUM CHLORIDE 0.9% FLUSH: 3.0000 mL | INTRAVENOUS | Status: DC | PRN

## 2016-08-30 MED ORDER — ZOLPIDEM TARTRATE 5 MG PO TABS
5.0000 mg | ORAL_TABLET | Freq: Every evening | ORAL | Status: DC | PRN
  Administered 2016-08-30 – 2016-09-01 (×3): 5 mg via ORAL
  Filled 2016-08-30 (×3): qty 1

## 2016-08-30 MED ORDER — PANTOPRAZOLE SODIUM 40 MG PO TBEC
80.0000 mg | DELAYED_RELEASE_TABLET | Freq: Every day | ORAL | Status: DC
Start: 2016-08-30 — End: 2016-09-02
  Administered 2016-08-30 – 2016-09-02 (×4): 80 mg via ORAL
  Filled 2016-08-30 (×4): qty 2

## 2016-08-30 MED ORDER — DEXTROSE 5 % IV SOLN
1.0000 g | Freq: Once | INTRAVENOUS | Status: AC
  Administered 2016-08-30: 1 g via INTRAVENOUS
  Filled 2016-08-30: qty 1

## 2016-08-30 MED ORDER — BIOTIN 2.5 MG PO CAPS: 2500.0000 mg | ORAL_CAPSULE | Freq: Every day | ORAL | Status: DC

## 2016-08-30 MED ORDER — FERROUS SULFATE 325 (65 FE) MG PO TABS
325.0000 mg | ORAL_TABLET | Freq: Every day | ORAL | Status: DC
  Administered 2016-08-31 – 2016-09-02 (×3): 325 mg via ORAL
  Filled 2016-08-30 (×3): qty 1

## 2016-08-30 MED ORDER — ROPINIROLE HCL 0.5 MG PO TABS
0.5000 mg | ORAL_TABLET | Freq: Every day | ORAL | Status: DC
  Administered 2016-08-30 – 2016-09-01 (×3): 0.5 mg via ORAL
  Filled 2016-08-30 (×3): qty 1

## 2016-08-30 MED ORDER — ASPIRIN 81 MG PO CHEW
81.0000 mg | CHEWABLE_TABLET | Freq: Every day | ORAL | Status: DC
  Administered 2016-08-30 – 2016-09-02 (×4): 81 mg via ORAL
  Filled 2016-08-30 (×4): qty 1

## 2016-08-30 MED ORDER — FUROSEMIDE 10 MG/ML IJ SOLN
40.0000 mg | Freq: Two times a day (BID) | INTRAMUSCULAR | Status: DC
  Administered 2016-08-30 – 2016-08-31 (×2): 40 mg via INTRAVENOUS
  Filled 2016-08-30 (×2): qty 4

## 2016-08-30 MED ORDER — SUCRALFATE 1 G PO TABS
1.0000 g | ORAL_TABLET | Freq: Every day | ORAL | Status: DC | PRN
  Administered 2016-08-31 – 2016-09-01 (×2): 1 g via ORAL
  Filled 2016-08-30 (×2): qty 1

## 2016-08-30 MED ORDER — TRAMADOL HCL 50 MG PO TABS
50.0000 mg | ORAL_TABLET | Freq: Four times a day (QID) | ORAL | Status: DC | PRN
  Administered 2016-09-01 (×2): 50 mg via ORAL
  Filled 2016-08-30 (×2): qty 1

## 2016-08-30 MED ORDER — ADULT MULTIVITAMIN W/MINERALS CH
1.0000 | ORAL_TABLET | Freq: Every day | ORAL | Status: DC
  Administered 2016-08-30 – 2016-09-02 (×4): 1 via ORAL
  Filled 2016-08-30 (×4): qty 1

## 2016-08-30 MED ORDER — GABAPENTIN 400 MG PO CAPS
400.0000 mg | ORAL_CAPSULE | Freq: Three times a day (TID) | ORAL | Status: DC | PRN
  Administered 2016-08-30 – 2016-08-31 (×2): 400 mg via ORAL
  Filled 2016-08-30 (×2): qty 1

## 2016-08-30 MED ORDER — LORAZEPAM 0.5 MG PO TABS
0.5000 mg | ORAL_TABLET | Freq: Two times a day (BID) | ORAL | Status: DC
  Administered 2016-08-30 – 2016-09-02 (×5): 0.5 mg via ORAL
  Filled 2016-08-30 (×6): qty 1

## 2016-08-30 MED ORDER — ROSUVASTATIN CALCIUM 20 MG PO TABS
20.0000 mg | ORAL_TABLET | Freq: Every day | ORAL | Status: DC
  Administered 2016-08-30 – 2016-09-01 (×3): 20 mg via ORAL
  Filled 2016-08-30 (×4): qty 1

## 2016-08-30 MED ORDER — CARVEDILOL 6.25 MG PO TABS
6.2500 mg | ORAL_TABLET | Freq: Two times a day (BID) | ORAL | Status: DC
  Administered 2016-08-30 – 2016-09-02 (×6): 6.25 mg via ORAL
  Filled 2016-08-30 (×7): qty 1

## 2016-08-30 MED ORDER — HYDROCORTISONE 2.5 % RE CREA
TOPICAL_CREAM | Freq: Two times a day (BID) | RECTAL | Status: DC | PRN
  Filled 2016-08-30: qty 28.35

## 2016-08-30 MED ORDER — VANCOMYCIN HCL IN DEXTROSE 1-5 GM/200ML-% IV SOLN
1000.0000 mg | INTRAVENOUS | Status: AC
  Administered 2016-08-30: 1000 mg via INTRAVENOUS
  Filled 2016-08-30: qty 200

## 2016-08-30 NOTE — ED Provider Notes (Signed)
[facility]-EMERGENCY DEPT Provider Note   CSN: [id number] Arrival date & time: 08/30/16  [id number]     History   Chief Complaint Chief Complaint  Patient presents with  . Fever    HPI Heather Walters is a [age] y.o. female.  Patient presents with family with concern for fever, SOB, weakness, nausea and vomiting. She became profoundly weak last night and husband reports she couldn't hold herself up to go to the bathroom. No syncope. She was recently treated for colitis in early December with symptoms of bloody diarrhea, as well as inpatient hospitalization for pneumonia, with discharge to rehab for a total of 16 days. She reports feeling better when she went home just before Christmas and became sick again over the last 1-2 days. She reports colonscopies in the past with history of polyps.   The history is provided by the patient. No language interpreter was used.  Fever   Associated symptoms include vomiting. Pertinent negatives include no chest pain and no headaches.    Past Medical History:  Diagnosis Date  . Arthritis   . Depression   . Fibromyalgia   . GERD (gastroesophageal reflux disease)   . Hyperlipidemia   . Hypertension   . Intervertebral disk disease    lumbar  . Neuropathy (HCC)   . Vitamin D deficiency     Patient Active Problem List   Diagnosis Date Noted  . S/P total knee arthroplasty 07/29/2014  . Post-operative infection 07/29/2014  . Hypertension   . Depression   . Neuropathy (HCC)   . GERD (gastroesophageal reflux disease)   . Hyperlipidemia   . Vitamin D deficiency     Past Surgical History:  Procedure Laterality Date  . ABDOMINAL HYSTERECTOMY    . CHOLECYSTECTOMY      OB History    No data available       Home Medications    Prior to Admission medications   Medication Sig Start Date End Date Taking? Authorizing Provider  acetaminophen (TYLENOL) 500 MG tablet Take 1,000 mg by mouth every 6 (six) hours as needed for fever.   Yes Historical  Provider, MD  amLODipine (NORVASC) 10 MG tablet Take 10 mg by mouth daily.   Yes Historical Provider, MD  aspirin 81 MG chewable tablet Chew 81 mg by mouth daily.   Yes Historical Provider, MD  BIOTIN PO Take 2,500 mg by mouth daily.   Yes Historical Provider, MD  carvedilol (COREG) 6.25 MG tablet Take 6.25 mg by mouth 2 (two) times daily with a meal.   Yes Historical Provider, MD  enalapril-hydrochlorothiazide (VASERETIC) 10-25 MG per tablet Take 1 tablet by mouth daily.    Yes Historical Provider, MD  escitalopram (LEXAPRO) 20 MG tablet Take 20 mg by mouth daily.   Yes Historical Provider, MD  ferrous sulfate (FEOSOL) 325 (65 FE) MG tablet Take 325 mg by mouth daily with breakfast.   Yes Historical Provider, MD  furosemide (LASIX) 40 MG tablet Take 40 mg by mouth daily as needed for fluid.    Yes Historical Provider, MD  gabapentin (NEURONTIN) 400 MG capsule Take 400 mg by mouth 3 (three) times daily as needed (nerve pain).    Yes Historical Provider, MD  Glucosamine-Chondroit-Vit C-Mn (GLUCOSAMINE 1500 COMPLEX PO) Take 1 tablet by mouth 2 (two) times daily.   Yes Historical Provider, MD  hydrALAZINE (APRESOLINE) 100 MG tablet Take 100 mg by mouth 3 (three) times daily.   Yes Historical Provider, MD  hydrochlorothiazide (HYDRODIURIL) 25 MG tablet Take  25 mg by mouth daily.   Yes Historical Provider, MD  isosorbide mononitrate (IMDUR) 60 MG 24 hr tablet Take 60 mg by mouth daily.   Yes Historical Provider, MD  LORazepam (ATIVAN) 0.5 MG tablet Take 0.5 mg by mouth 2 (two) times daily.   Yes Historical Provider, MD  Multiple Vitamin (MULTIVITAMIN WITH MINERALS) TABS tablet Take 1 tablet by mouth daily.   Yes Historical Provider, MD  nitroGLYCERIN (NITROSTAT) 0.4 MG SL tablet Place 0.4 mg under the tongue every 5 (five) minutes as needed for chest pain.   Yes Historical Provider, MD  nystatin (MYCOSTATIN) 100000 UNIT/ML suspension Take 5 mLs by mouth 4 (four) times daily.   Yes Historical Provider, MD   nystatin (NYSTATIN) powder Apply topically 2 (two) times daily.   Yes Historical Provider, MD  omeprazole (PRILOSEC) 40 MG capsule Take 40 mg by mouth daily.   Yes Historical Provider, MD  oxybutynin (DITROPAN-XL) 10 MG 24 hr tablet Take 10 mg by mouth at bedtime.   Yes Historical Provider, MD  pentazocine-naloxone (TALWIN NX) 50-0.5 MG tablet Take 1 tablet by mouth every 6 (six) hours as needed for moderate pain.   Yes Historical Provider, MD  rOPINIRole (REQUIP) 0.5 MG tablet Take 0.5 mg by mouth at bedtime.   Yes Historical Provider, MD  rosuvastatin (CRESTOR) 20 MG tablet Take 20 mg by mouth daily.   Yes Historical Provider, MD  sucralfate (CARAFATE) 1 G tablet Take 1 g by mouth daily as needed (as directed).    Yes Historical Provider, MD  traMADol (ULTRAM) 50 MG tablet Take 50 mg by mouth every 6 (six) hours as needed for severe pain.   Yes Historical Provider, MD  traZODone (DESYREL) 50 MG tablet Take 50 mg by mouth at bedtime.   Yes Historical Provider, MD  Vitamin D, Ergocalciferol, (DRISDOL) 50000 UNITS CAPS capsule Take 50,000 Units by mouth every 7 (seven) days.   Yes Historical Provider, MD  zolpidem (AMBIEN) 10 MG tablet Take 10 mg by mouth at bedtime.   Yes Historical Provider, MD  dicyclomine (BENTYL) 10 MG capsule Take 10 mg by mouth 3 (three) times daily before meals.    Historical Provider, MD  metoCLOPramide (REGLAN) 10 MG tablet Take 10 mg by mouth 2 (two) times daily.    Historical Provider, MD  oxyCODONE-acetaminophen (PERCOCET) 7.5-325 MG per tablet Take one or two tablets by mouth every 4-6 hours as needed for pain 08/06/14   Tiffany L Reed, DO  promethazine (PHENERGAN) 25 MG tablet Take 25 mg by mouth every 6 (six) hours as needed for nausea or vomiting.    Historical Provider, MD  terbinafine (LAMISIL) 250 MG tablet Take 250 mg by mouth daily. For 7 days every month    Historical Provider, MD    Family History No family history on file.  Social History Social  History  Substance Use Topics  . Smoking status: Never Smoker  . Smokeless tobacco: Never Used  . Alcohol use No     Allergies   Codeine and Lipitor [atorvastatin]   Review of Systems Review of Systems  Constitutional: Positive for fever. Negative for chills.  HENT: Negative.   Respiratory: Positive for shortness of breath.   Cardiovascular: Positive for leg swelling. Negative for chest pain.  Gastrointestinal: Positive for abdominal pain, blood in stool, nausea and vomiting.  Genitourinary: Negative.  Negative for dysuria.  Musculoskeletal: Positive for myalgias.  Skin: Negative.  Negative for rash.  Neurological: Positive for weakness. Negative for headaches.  Psychiatric/Behavioral: Negative for confusion.     Physical Exam Updated Vital Signs BP 137/60   Pulse 70   Temp 100.8 F (38.2 C) (Rectal)   Resp 17   Ht 5' (1.524 m)   Wt 98.9 kg   SpO2 95%   BMI 42.58 kg/m   Physical Exam  Constitutional: She is oriented to person, place, and time. She appears well-developed and well-nourished.  HENT:  Head: Normocephalic.  Neck: Normal range of motion. Neck supple.  Cardiovascular: Normal rate and regular rhythm.   No murmur heard. Pulmonary/Chest: Effort normal. No respiratory distress. She has rales (Bilateral bases).  Abdominal: Soft. Bowel sounds are normal. There is no tenderness. There is no rebound and no guarding.  Musculoskeletal: Normal range of motion. She exhibits edema (2+ pitting edema bilaterally).  Neurological: She is alert and oriented to person, place, and time.  Skin: Skin is warm and dry. No rash noted.  Psychiatric: She has a normal mood and affect.     ED Treatments / Results  Labs (all labs ordered are listed, but only abnormal results are displayed) Labs Reviewed  CBC WITH DIFFERENTIAL/PLATELET - Abnormal; Notable for the following:       Result Value   WBC 23.8 (*)    RBC 3.56 (*)    Hemoglobin 11.0 (*)    HCT 33.4 (*)    Neutro  Abs 19.7 (*)    Monocytes Absolute 1.2 (*)    All other components within normal limits  URINE CULTURE  CULTURE, BLOOD (ROUTINE X 2)  CULTURE, BLOOD (ROUTINE X 2)  COMPREHENSIVE METABOLIC PANEL  LACTIC ACID, PLASMA  LACTIC ACID, PLASMA  URINALYSIS, ROUTINE W REFLEX MICROSCOPIC  TROPONIN I  BRAIN NATRIURETIC PEPTIDE  INFLUENZA PANEL BY PCR (TYPE A & B, H1N1)    EKG  EKG Interpretation None       Radiology Dg Chest 2 View  Result Date: 08/30/2016 CLINICAL DATA:  Fever, hypoxia this morning.  Recent pneumonia. EXAM: CHEST  2 VIEW COMPARISON:  Chest radiograph August 14, 2016 FINDINGS: Cardiac silhouette is moderately enlarged unchanged. Mediastinal silhouette is nonsuspicious. Similar pulmonary vascular congestion without pleural effusion or focal consolidation. No pneumothorax. Osteopenia. Mild degenerative change of thoracic and lumbar spine. IMPRESSION: Stable cardiomegaly and pulmonary vascular congestion. Electronically Signed   By: Awilda Metro M.D.   On: 08/30/2016 05:36    Procedures Procedures (including critical care time)  Medications Ordered in ED Medications  ceFEPIme (MAXIPIME) 1 g in dextrose 5 % 50 mL IVPB (1 g Intravenous New Bag/Given 08/30/16 0557)  vancomycin (VANCOCIN) IVPB 1000 mg/200 mL premix (not administered)  sodium chloride 0.9 % bolus 500 mL (500 mLs Intravenous New Bag/Given 08/30/16 0557)     Initial Impression / Assessment and Plan / ED Course  I have reviewed the triage vital signs and the nursing notes.  Pertinent labs & imaging results that were available during my care of the patient were reviewed by me and considered in my medical decision making (see chart for details).  Clinical Course     Patient presents with generalized weakness, SOB, N, V and fever x 2 days. History of CHF, recent PNA, recent colitis with rectal bleeding. CXR supports vascular congestion without visualized pneumonia.   She is awake and alert with full  orientation. WBC's over 23K. Remainder of labs pending. Patient care assumed by Dr. Criss Alvine with anticipated admission.  Final Clinical Impressions(s) / ED Diagnoses   Final diagnoses:  None   1. Febrile illness  2. CHF  New Prescriptions New Prescriptions   No medications on file     Elpidio Anis, PA-C 08/30/16 1610    Pricilla Loveless, MD 08/30/16 [id number]

## 2016-08-30 NOTE — ED Notes (Signed)
Patient transported to X-ray 

## 2016-08-30 NOTE — ED Triage Notes (Signed)
Per EMS pt/ c/o fever x few hours. Pt admitted to [facility] before christmas for Pneumonia and dishcarged.Denies NVD or pain at this time. Pt w/ Spo2 of 88% on RA and 97% on 4L [place]. Pt AOx4.

## 2016-08-30 NOTE — Progress Notes (Signed)
*  PRELIMINARY RESULTS* Vascular Ultrasound Bilateral lower extremity venous duplex has been completed.  Preliminary findings: No evidence of deep vein thrombosis in the visualized veins of the lower extremities.  Negative for baker's cysts.    Heather FischerCharlotte C Ronetta Walters 08/30/2016, 1:54 PM

## 2016-08-30 NOTE — Consult Note (Addendum)
Consultation  Referring Provider:  Dr. Arbutus Leas    Primary Care Physician:  Margaree Mackintosh, MD Primary Gastroenterologist:  [facility]      Reason for Consultation:  Hematochezia      Impression / Plan:  Impression: 1. Hematochezia: Baseline hemoglobin is around 12/13, at time of admission, this was at 11, patient has been continued on aspirin in the setting of a recent acute stroke less than a month ago and is hemodynamically stable; consider hemorrhoids versus diverticular bleed vs other 2. Acute on chronic diastolic CHF 3. Fever/leukocytosis 4. Atypical chest pain 5. Recent stroke/generalized weakness:  Plan: 1. Patient has acute exacerbation of CHF and pulmonary vascular congestion, at this time do not believe that her hematochezia is concerning as this occurred during two isolated events yesterday with a BM, hemoglobin may be decreased just from chronic illness recently . It also appears patient is supposed to call [facility] in regards to an upcoming EGD and colonoscopy within the next month 2. Would recommend continued observation of the patient, if further signs of acute GI bleed, we will consider endoscopic evaluation, otherwise we will sign off for now 3. Please await any further recommendations from Dr. Leone Payor  Thank you for your kind consultation, please call us if further signs of acute GI bleed.  Violet Baldy Lemmon  08/30/2016, 11:18 AM Pager #: [phone]    [facility] Attending   I have taken an interval history, reviewed the chart and examined the patient. I agree with the Advanced Practitioner's note, impression and recommendations.   She has minor hematochezia vs rectal bleeding and hemorrhoids seem most likely. No bleeding since. Do not think would do endoscopic investigation unless more sig evidence of bleeding. HC cream prn  Call us back if more significant bleeding/other ?  Iva Boop, MD, [facility] [phone]  (pager) [phone] after 5 PM, weekends and holidays  08/30/2016 5:05 PM          HPI:   Heather Walters is a 83 y.o. female with a past medical history arthritis, depression, GERD, hyperlipidemia, hypertension, fibromyalgia and neuropathy who presented to the ED with 1-2 days of generalized weakness, cough and shortness of breath. We were consulted due to a report of hematochezia yesterday.   Today, it should be noted that at time of my interview the patient is quite lethargic, she describes that she developed a fever of up to 102 on the evening of 08/29/16. She had recently been discharged from [facility] after stay from 07/30/16 through 08/04/16 at which time she was treated for acute respiratory failure secondary to CHF and pneumonia. She was discharged to rehabilitation facility where she stayed for 2 weeks prior to going home. During her hospitalization the patient was also diagnosed with acute left frontoparietal infarct noted on MRI of the brain. She is scheduled for loop recorder placement in the future. She has had intermittent sharp chest pain that is substernal without any radiation since discharge from the hospital. Since discharge the patient describes a mild increase in abdominal girth and denies change in bilateral leg edema. She has some orthopnea without any PND.    The patient tells me today that she had 2 episodes of bright red blood mixed in with her stool on 08/29/16. Her husband is by her side and tries to assist with her history. He tells me that a similar episode occurred at the beginning of December and the patient was seen in [place] by a gastroenterologist there and told that likely this was just from hemorrhoids. Patient describes a chronic history of IBS D noting that she has frequent looser than normal stools. This has not changed recently. The patient denies any sick contacts or abdominal pain.   Past medical history is significant for a colonoscopy within the  past 3 years which was repeated multiple times due to findings of a "flat polyp they couldn't remove", apparently she had 3-4 colonoscopies in a short span of time in order to "shave this down". The last one seemed to get rid of it per her husband. He believes this was in her rectum.  ED course:  Patient was found hemodynamically stable but had a temperature 100.2. Per EMS the patient was hypoxic with oxygen saturation in the high 80s. Chest x-ray showed cardiovascular congestion . There were no distinct consolidations. Patient was started on vancomycin and cefepime after blood cultures and urine cultures were obtained in the emergency department. White blood cell count was elevated at 23.8 and hemoglobin was found to be 11.  Previous GI history: Per patient report she had a colonoscopy proximally 3 years ago at which time a precancerous lesion was found in [facility]. Per chart reviewed it appears she was seen at [facility] 04/17/14 for a colonoscopy and was told to repeat this in 2 years? We do not have any further reports. It also appears per phone notes that the patient is possibly set up for an EGD/colonoscopy in January? According to Dr. Rocky Crafts at [facility]?  Past Medical History:  Diagnosis Date  . Arthritis   . Depression   . Fibromyalgia   . GERD (gastroesophageal reflux disease)   . Hyperlipidemia   . Hypertension   . Intervertebral disk disease    lumbar  . Neuropathy (HCC)   . Vitamin D deficiency     Past Surgical History:  Procedure Laterality Date  . ABDOMINAL HYSTERECTOMY    . CHOLECYSTECTOMY      Family history: She denies family history of colon cancer or colon polyps, liver or pancreatic disease, irritable bowel disease, breast or ovarian cancer  Social History  Substance Use Topics  . Smoking status: Never Smoker  . Smokeless tobacco: Never Used  . Alcohol use No    Prior to Admission medications   Medication Sig  Start Date End Date Taking? Authorizing Provider  acetaminophen (TYLENOL) 500 MG tablet Take 1,000 mg by mouth every 6 (six) hours as needed for fever.   Yes Historical Provider, MD  amLODipine (NORVASC) 10 MG tablet Take 10 mg by mouth daily.   Yes Historical Provider, MD  aspirin 81 MG chewable tablet Chew 81 mg by mouth daily.   Yes Historical Provider, MD  BIOTIN PO Take 2,500 mg by mouth daily.   Yes Historical Provider, MD  carvedilol (COREG) 6.25 MG tablet Take 6.25 mg by mouth 2 (two) times daily with a meal.   Yes Historical Provider, MD  enalapril-hydrochlorothiazide (VASERETIC) 10-25 MG per tablet Take 1 tablet by mouth daily.    Yes Historical Provider, MD  escitalopram (LEXAPRO) 20 MG tablet Take 20 mg by mouth daily.   Yes Historical Provider, MD  ferrous sulfate (FEOSOL) 325 (65 FE) MG tablet Take 325 mg by mouth daily with breakfast.   Yes Historical Provider, MD  furosemide (LASIX) 40 MG tablet Take 40 mg by mouth daily as needed for fluid.    Yes Historical Provider, MD  gabapentin (NEURONTIN) 400 MG capsule Take 400 mg by mouth 3 (three) times daily as needed (nerve pain).    Yes Historical Provider, MD  Glucosamine-Chondroit-Vit C-Mn (GLUCOSAMINE 1500 COMPLEX PO) Take 1 tablet by mouth 2 (two) times daily.   Yes Historical Provider, MD  hydrALAZINE (APRESOLINE) 100 MG tablet Take 100 mg by mouth 3 (three) times daily.   Yes Historical Provider, MD  hydrochlorothiazide (HYDRODIURIL) 25 MG tablet Take 25 mg by mouth daily.   Yes Historical Provider, MD  isosorbide mononitrate (IMDUR) 60 MG 24 hr tablet Take 60 mg by mouth daily.   Yes Historical Provider, MD  LORazepam (ATIVAN) 0.5 MG tablet Take 0.5 mg by mouth 2 (two) times daily.   Yes Historical Provider, MD  Multiple Vitamin (MULTIVITAMIN WITH MINERALS) TABS tablet Take 1 tablet by mouth daily.   Yes Historical Provider, MD  nitroGLYCERIN (NITROSTAT) 0.4 MG SL tablet Place 0.4 mg under the tongue every 5 (five) minutes as  needed for chest pain.   Yes Historical Provider, MD  nystatin (MYCOSTATIN) 100000 UNIT/ML suspension Take 5 mLs by mouth 4 (four) times daily.   Yes Historical Provider, MD  nystatin (NYSTATIN) powder Apply topically 2 (two) times daily.   Yes Historical Provider, MD  omeprazole (PRILOSEC) 40 MG capsule Take 40 mg by mouth daily.   Yes Historical Provider, MD  oxybutynin (DITROPAN-XL) 10 MG 24 hr tablet Take 10 mg by mouth at bedtime.   Yes Historical Provider, MD  pentazocine-naloxone (TALWIN NX) 50-0.5 MG tablet Take 1 tablet by mouth every 6 (six) hours as needed for moderate pain.   Yes Historical Provider, MD  promethazine (PHENERGAN) 25 MG tablet Take 25 mg by mouth every 6 (six) hours as needed for nausea or vomiting.   Yes Historical Provider, MD  rOPINIRole (REQUIP) 0.5 MG tablet Take 0.5 mg by mouth at bedtime.   Yes Historical Provider, MD  rosuvastatin (CRESTOR) 20 MG tablet Take 20 mg by mouth daily.   Yes Historical Provider, MD  sucralfate (CARAFATE) 1 G tablet Take 1 g by mouth daily as needed (as directed).    Yes Historical Provider, MD  traMADol (ULTRAM) 50 MG tablet Take 50 mg by mouth every 6 (six) hours as needed for severe pain.   Yes Historical Provider, MD  traZODone (DESYREL) 50 MG tablet Take 50 mg by mouth at bedtime.   Yes Historical Provider, MD  Vitamin D, Ergocalciferol, (DRISDOL) 50000 UNITS CAPS capsule Take 50,000 Units by mouth every 7 (seven) days.   Yes Historical Provider, MD  zolpidem (AMBIEN) 10 MG tablet Take 10 mg by mouth at bedtime.   Yes Historical Provider, MD  oxyCODONE-acetaminophen (PERCOCET) 7.5-325 MG per tablet Take one or two tablets by mouth every 4-6 hours as needed for pain Patient not taking: Reported on 08/30/2016 08/06/14   Kermit Balo, DO    Current Facility-Administered Medications  Medication Dose Route Frequency Provider Last Rate Last Dose  . 0.9 %  sodium chloride infusion  250 mL Intravenous PRN Catarina Hartshorn, MD      .  acetaminophen (TYLENOL) tablet 1,000 mg  1,000 mg Oral Q6H PRN Catarina Hartshorn, MD      . acetaminophen (TYLENOL) tablet 650 mg  650 mg Oral Q4H PRN Catarina Hartshorn, MD      . aspirin chewable tablet 81 mg  81 mg Oral Daily David Tat, MD      . carvedilol (COREG) tablet 6.25 mg  6.25 mg Oral BID WC Catarina Hartshorn,  MD      . ceFEPIme (MAXIPIME) 1 g in dextrose 5 % 50 mL IVPB  1 g Intravenous Q12H Lorenza Evangelist, RPH      . enalapril (VASOTEC) tablet 10 mg  10 mg Oral Daily Catarina Hartshorn, MD      . escitalopram (LEXAPRO) tablet 20 mg  20 mg Oral Daily Catarina Hartshorn, MD      . Melene Muller ON 08/31/2016] ferrous sulfate tablet 325 mg  325 mg Oral Q breakfast Catarina Hartshorn, MD      . furosemide (LASIX) injection 40 mg  40 mg Intravenous BID Catarina Hartshorn, MD      . gabapentin (NEURONTIN) capsule 400 mg  400 mg Oral TID PRN Catarina Hartshorn, MD      . isosorbide mononitrate (IMDUR) 24 hr tablet 60 mg  60 mg Oral Daily Onalee Hua Tat, MD      . LORazepam (ATIVAN) tablet 0.5 mg  0.5 mg Oral BID Catarina Hartshorn, MD      . multivitamin with minerals tablet 1 tablet  1 tablet Oral Daily Onalee Hua Tat, MD      . ondansetron (ZOFRAN) injection 4 mg  4 mg Intravenous Q6H PRN Catarina Hartshorn, MD      . oxybutynin (DITROPAN-XL) 24 hr tablet 10 mg  10 mg Oral QHS Catarina Hartshorn, MD      . pantoprazole (PROTONIX) EC tablet 80 mg  80 mg Oral Daily Onalee Hua Tat, MD      . pentazocine-naloxone (TALWIN NX) 50-0.5 MG per tablet 1 tablet  1 tablet Oral Q6H PRN Catarina Hartshorn, MD      . rOPINIRole (REQUIP) tablet 0.5 mg  0.5 mg Oral QHS David Tat, MD      . rosuvastatin (CRESTOR) tablet 20 mg  20 mg Oral q1800 Onalee Hua Tat, MD      . sodium chloride flush (NS) 0.9 % injection 3 mL  3 mL Intravenous Q12H David Tat, MD      . sodium chloride flush (NS) 0.9 % injection 3 mL  3 mL Intravenous PRN Onalee Hua Tat, MD      . sucralfate (CARAFATE) tablet 1 g  1 g Oral Daily PRN Catarina Hartshorn, MD      . traMADol Janean Sark) tablet 50 mg  50 mg Oral Q6H PRN Catarina Hartshorn, MD      . traZODone (DESYREL) tablet 50 mg  50 mg  Oral QHS David Tat, MD      . vancomycin (VANCOCIN) IVPB 750 mg/150 ml premix  750 mg Intravenous Q12H Lorenza Evangelist, [facility]       Current Outpatient Prescriptions  Medication Sig Dispense Refill  . acetaminophen (TYLENOL) 500 MG tablet Take 1,000 mg by mouth every 6 (six) hours as needed for fever.    Marland Kitchen amLODipine (NORVASC) 10 MG tablet Take 10 mg by mouth daily.    Marland Kitchen aspirin 81 MG chewable tablet Chew 81 mg by mouth daily.    Marland Kitchen BIOTIN PO Take 2,500 mg by mouth daily.    . carvedilol (COREG) 6.25 MG tablet Take 6.25 mg by mouth 2 (two) times daily with a meal.    . enalapril-hydrochlorothiazide (VASERETIC) 10-25 MG per tablet Take 1 tablet by mouth daily.     Marland Kitchen escitalopram (LEXAPRO) 20 MG tablet Take 20 mg by mouth daily.    . ferrous sulfate (FEOSOL) 325 (65 FE) MG tablet Take 325 mg by mouth daily with breakfast.    . furosemide (LASIX) 40 MG tablet Take 40 mg  by mouth daily as needed for fluid.     Marland Kitchen gabapentin (NEURONTIN) 400 MG capsule Take 400 mg by mouth 3 (three) times daily as needed (nerve pain).     . Glucosamine-Chondroit-Vit C-Mn (GLUCOSAMINE 1500 COMPLEX PO) Take 1 tablet by mouth 2 (two) times daily.    . hydrALAZINE (APRESOLINE) 100 MG tablet Take 100 mg by mouth 3 (three) times daily.    . hydrochlorothiazide (HYDRODIURIL) 25 MG tablet Take 25 mg by mouth daily.    . isosorbide mononitrate (IMDUR) 60 MG 24 hr tablet Take 60 mg by mouth daily.    Marland Kitchen LORazepam (ATIVAN) 0.5 MG tablet Take 0.5 mg by mouth 2 (two) times daily.    . Multiple Vitamin (MULTIVITAMIN WITH MINERALS) TABS tablet Take 1 tablet by mouth daily.    . nitroGLYCERIN (NITROSTAT) 0.4 MG SL tablet Place 0.4 mg under the tongue every 5 (five) minutes as needed for chest pain.    Marland Kitchen nystatin (MYCOSTATIN) 100000 UNIT/ML suspension Take 5 mLs by mouth 4 (four) times daily.    Marland Kitchen nystatin (NYSTATIN) powder Apply topically 2 (two) times daily.    Marland Kitchen omeprazole (PRILOSEC) 40 MG capsule Take 40 mg by mouth daily.    Marland Kitchen  oxybutynin (DITROPAN-XL) 10 MG 24 hr tablet Take 10 mg by mouth at bedtime.    . pentazocine-naloxone (TALWIN NX) 50-0.5 MG tablet Take 1 tablet by mouth every 6 (six) hours as needed for moderate pain.    . promethazine (PHENERGAN) 25 MG tablet Take 25 mg by mouth every 6 (six) hours as needed for nausea or vomiting.    Marland Kitchen rOPINIRole (REQUIP) 0.5 MG tablet Take 0.5 mg by mouth at bedtime.    . rosuvastatin (CRESTOR) 20 MG tablet Take 20 mg by mouth daily.    . sucralfate (CARAFATE) 1 G tablet Take 1 g by mouth daily as needed (as directed).     . traMADol (ULTRAM) 50 MG tablet Take 50 mg by mouth every 6 (six) hours as needed for severe pain.    . traZODone (DESYREL) 50 MG tablet Take 50 mg by mouth at bedtime.    . Vitamin D, Ergocalciferol, (DRISDOL) 50000 UNITS CAPS capsule Take 50,000 Units by mouth every 7 (seven) days.    Marland Kitchen zolpidem (AMBIEN) 10 MG tablet Take 10 mg by mouth at bedtime.    Marland Kitchen oxyCODONE-acetaminophen (PERCOCET) 7.5-325 MG per tablet Take one or two tablets by mouth every 4-6 hours as needed for pain (Patient not taking: Reported on 08/30/2016) 360 tablet 0    Allergies as of 08/30/2016 - Review Complete 08/30/2016  Allergen Reaction Noted  . Codeine Other (See Comments) 07/29/2014  . Lipitor [atorvastatin] Other (See Comments) 07/29/2014     Review of Systems:    Constitutional: No weight loss, fever or chills HEENT: Eyes: No change in vision               Ears, Nose, Throat:  No change in hearing  Cardiovascular: See HPI Respiratory: Positive for SOB and hypoxia No cough Gastrointestinal: See HPI and otherwise negative Genitourinary: No dysuria  Neurological: No headache Musculoskeletal: No new muscle or joint pain Hematologic: No bruising Psychiatric: No history of depression or anxiety   Physical Exam:  Vital signs in last 24 hours: Temp:  [98.6 F (37 C)-100.8 F (38.2 C)] 98.6 F (37 C) (01/03 0732) Pulse Rate:  [59-98] 69 (01/03 1040) Resp:  [15-20] 17  (01/03 1040) BP: (112-147)/(41-60) 147/47 (01/03 1040) SpO2:  [90 %-99 %]  96 % (01/03 1040) Weight:  [218 lb 0.6 oz (98.9 kg)] 218 lb 0.6 oz (98.9 kg) (01/03 0617)   General: Lethargic Caucasian female appears to be in NAD, Well developed, Well nourished, alert and cooperative Head:  Normocephalic and atraumatic. Eyes:   PEERL, EOMI. No icterus. Conjunctiva pink. Ears:  Normal auditory acuity. Neck:  Supple Throat: Oral cavity and pharynx without inflammation, swelling or lesion. Lungs: Respirations even and unlabored. Lungs clear to auscultation bilaterally.   No wheezes, crackles, or rhonchi.  Heart: Normal S1, S2. No MRG. Regular rate and rhythm. No peripheral edema, cyanosis or pallor.  Abdomen:  Soft, nondistended, nontender. No rebound or guarding. Normal bowel sounds. No appreciable masses or hepatomegaly. Rectal:  External exam: External hemorrhoid, no tenderness; Internal exam: no fullness, no tenderness, no discharge Msk:  Symmetrical without gross deformities. Peripheral pulses intact.  Extremities:  Without edema, no deformity or joint abnormality. Neurologic:  Alert and  oriented x4;  grossly normal neurologically.  Skin:   Dry and intact without significant lesions or rashes. Psychiatric: Drowsy Demonstrates good judgement and reason without abnormal affect or behaviors.   LAB RESULTS:  Recent Labs  08/30/16 0530  WBC 23.8*  HGB 11.0*  HCT 33.4*  PLT 228   BMET  Recent Labs  08/30/16 0530  NA 134*  K 4.0  CL 97*  CO2 27  GLUCOSE 118*  BUN 30*  CREATININE 1.04*  CALCIUM 9.4   LFT  Recent Labs  08/30/16 0530  PROT 5.9*  ALBUMIN 3.1*  AST 18  ALT 14  ALKPHOS 80  BILITOT 0.4   PT/INR No results for input(s): LABPROT, INR in the last 72 hours.  STUDIES: Dg Chest 2 View  Result Date: 08/30/2016 CLINICAL DATA:  Fever, hypoxia this morning.  Recent pneumonia. EXAM: CHEST  2 VIEW COMPARISON:  Chest radiograph August 14, 2016 FINDINGS: Cardiac  silhouette is moderately enlarged unchanged. Mediastinal silhouette is nonsuspicious. Similar pulmonary vascular congestion without pleural effusion or focal consolidation. No pneumothorax. Osteopenia. Mild degenerative change of thoracic and lumbar spine. IMPRESSION: Stable cardiomegaly and pulmonary vascular congestion. Electronically Signed   By: Awilda Metroourtnay  Bloomer M.D.   On: 08/30/2016 05:36    PREVIOUS ENDOSCOPIES:            See HPI-[place]

## 2016-08-30 NOTE — H&P (Addendum)
History and Physical  Heather Walters RUE:[id number] DOB: 28-Jan-1932 DOA: 08/30/2016   PCP: Margaree Mackintosh, MD   Patient coming from: Home  Chief Complaint: Generalized weakness, chest pain, shortness of breath  HPI:  Heather Walters is a 83 y.o. female with medical history of diastolic CHF, hypertension, hyperlipidemia, fibromyalgia, GERD who presents with 1-2 day history of generalized weakness, cough, and shortness of breath. The patient developed a fever up to 102.56F on the evening of 08/29/2016. She was recently discharged from [facility] after a stay from 07/30/2016 through 08/04/2016 at which time she was treated for acute respiratory failure secondary to CHF and pneumonia. Her discharge weight was 214 pounds. She was discharged to a rehabilitation facility where she stayed for 2 weeks prior to going home. She was discharged with levofloxacin and Lasix 40 mg when necessary. During her hospitalization, the patient was also diagnosed with acute left frontoparietal infarct noted on MRI of the brain. She is scheduled to have a loop recorder placement in the future. She states that she has had intermittent sharp chest pain that is substernal without any radiation since discharge from the hospital. There are no alleviating or aggravating factors. Her chest and occasionally worsens with anxiety as well was with some movements. She denies any headache, neck pain, nausea, vomiting, diarrhea. Since discharge, the patient states that she has had a mild increase in her abdominal girth, and states that her leg edema has not changed. She has some orthopnea without any PND. She denies any sick contacts. She states that she has had 1-2 episodes of hematochezia on the evening of 08/29/2016. She states that she had a colonoscopy approximately 3 years ago at which time a precancerous lesion was found. Her gastroenterologist is in [facility].  In the emergency department, the patient was  hemodynamically stable, but had a temperature of 100.11F. Per EMS, the patient was hypoxic with oxygen saturation in the high 80s. She is stable on 2 L nasal cannula at the time of my examination. The CBC was 23.8. Chest x-ray showed cardiovascular congestion without fulminant edema. There are no distinct consolidations. The patient was started on vancomycin and cefepime after blood cultures and urine cultures were obtained in emergency department.  Assessment/Plan: Acute on chronic diastolic CHF -daily weights -She is clinically fluid overloaded and has JVD on examination -accurate I/O's -fluid restrict -07/31/2016 Echo EF 60-65%, trace TR  (done at [facility]) -continue IV Lasix 40 mg IV bid -08/04/2016 discharge weight 214 pounds -Continue carvedilol imdur  Fever/leukocytosis -Workup in progress  -Chest x-ray without distinct infiltrate  -Continue vancomycin and cefepime pending culture data  -Lactic acid 1.1  -Chek procalcitonin  -Urinalysis without pyuria  -Influenza PCR  -Respiratory viral panel   Atypical chest pain -10/12/2015 Myoview--low risk, EF 75% -07/31/2016 CT angiogram chest negative for pulmonary embolus -Cycle troponins -EKG--sinus rhythm, nonspecific T-wave change  Hypertension -Hold amlodipine and HCTZ -Hold hydralazine -Continue enalapril and carvedilol -Reevaluate for optimal timing of restarting above antihypertensive medications  Lower extremity edema and pain -Venous duplex  Hematochezia -[facility] has been consulted -baseline Hgb ~12 to 13 -continue ASA for now in setting of recent acute stroke < 1 month ago as pt is hemodynamically stable -SCDs only for DVT prophylaxis  Fibromyalgia/chronic pain  -Continue home doses of opioids  -Continue Lexapro and   Recent stroke / Generalized weakness -Patient was recently discharged from rehabilitation approximately 2 weeks prior to this admission  -PT/OT  -  TSH -UA--no  pyuria      Past Medical History:  Diagnosis Date  . Arthritis   . Depression   . Fibromyalgia   . GERD (gastroesophageal reflux disease)   . Hyperlipidemia   . Hypertension   . Intervertebral disk disease    lumbar  . Neuropathy (HCC)   . Vitamin D deficiency    Past Surgical History:  Procedure Laterality Date  . ABDOMINAL HYSTERECTOMY    . CHOLECYSTECTOMY     Social History:  reports that she has never smoked. She has never used smokeless tobacco. She reports that she does not drink alcohol or use drugs.   No family history on file.   Allergies  Allergen Reactions  . Codeine Other (See Comments)    Unknown reaction  . Lipitor [Atorvastatin] Other (See Comments)    Caused cramping in her legs     Prior to Admission medications   Medication Sig Start Date End Date Taking? Authorizing Provider  acetaminophen (TYLENOL) 500 MG tablet Take 1,000 mg by mouth every 6 (six) hours as needed for fever.   Yes Historical Provider, MD  amLODipine (NORVASC) 10 MG tablet Take 10 mg by mouth daily.   Yes Historical Provider, MD  aspirin 81 MG chewable tablet Chew 81 mg by mouth daily.   Yes Historical Provider, MD  BIOTIN PO Take 2,500 mg by mouth daily.   Yes Historical Provider, MD  carvedilol (COREG) 6.25 MG tablet Take 6.25 mg by mouth 2 (two) times daily with a meal.   Yes Historical Provider, MD  enalapril-hydrochlorothiazide (VASERETIC) 10-25 MG per tablet Take 1 tablet by mouth daily.    Yes Historical Provider, MD  escitalopram (LEXAPRO) 20 MG tablet Take 20 mg by mouth daily.   Yes Historical Provider, MD  ferrous sulfate (FEOSOL) 325 (65 FE) MG tablet Take 325 mg by mouth daily with breakfast.   Yes Historical Provider, MD  furosemide (LASIX) 40 MG tablet Take 40 mg by mouth daily as needed for fluid.    Yes Historical Provider, MD  gabapentin (NEURONTIN) 400 MG capsule Take 400 mg by mouth 3 (three) times daily as needed (nerve pain).    Yes Historical Provider, MD   Glucosamine-Chondroit-Vit C-Mn (GLUCOSAMINE 1500 COMPLEX PO) Take 1 tablet by mouth 2 (two) times daily.   Yes Historical Provider, MD  hydrALAZINE (APRESOLINE) 100 MG tablet Take 100 mg by mouth 3 (three) times daily.   Yes Historical Provider, MD  hydrochlorothiazide (HYDRODIURIL) 25 MG tablet Take 25 mg by mouth daily.   Yes Historical Provider, MD  isosorbide mononitrate (IMDUR) 60 MG 24 hr tablet Take 60 mg by mouth daily.   Yes Historical Provider, MD  LORazepam (ATIVAN) 0.5 MG tablet Take 0.5 mg by mouth 2 (two) times daily.   Yes Historical Provider, MD  Multiple Vitamin (MULTIVITAMIN WITH MINERALS) TABS tablet Take 1 tablet by mouth daily.   Yes Historical Provider, MD  nitroGLYCERIN (NITROSTAT) 0.4 MG SL tablet Place 0.4 mg under the tongue every 5 (five) minutes as needed for chest pain.   Yes Historical Provider, MD  nystatin (MYCOSTATIN) 100000 UNIT/ML suspension Take 5 mLs by mouth 4 (four) times daily.   Yes Historical Provider, MD  nystatin (NYSTATIN) powder Apply topically 2 (two) times daily.   Yes Historical Provider, MD  omeprazole (PRILOSEC) 40 MG capsule Take 40 mg by mouth daily.   Yes Historical Provider, MD  oxybutynin (DITROPAN-XL) 10 MG 24 hr tablet Take 10 mg by mouth at bedtime.  Yes Historical Provider, MD  pentazocine-naloxone (TALWIN NX) 50-0.5 MG tablet Take 1 tablet by mouth every 6 (six) hours as needed for moderate pain.   Yes Historical Provider, MD  promethazine (PHENERGAN) 25 MG tablet Take 25 mg by mouth every 6 (six) hours as needed for nausea or vomiting.   Yes Historical Provider, MD  rOPINIRole (REQUIP) 0.5 MG tablet Take 0.5 mg by mouth at bedtime.   Yes Historical Provider, MD  rosuvastatin (CRESTOR) 20 MG tablet Take 20 mg by mouth daily.   Yes Historical Provider, MD  sucralfate (CARAFATE) 1 G tablet Take 1 g by mouth daily as needed (as directed).    Yes Historical Provider, MD  traMADol (ULTRAM) 50 MG tablet Take 50 mg by mouth every 6 (six) hours  as needed for severe pain.   Yes Historical Provider, MD  traZODone (DESYREL) 50 MG tablet Take 50 mg by mouth at bedtime.   Yes Historical Provider, MD  Vitamin D, Ergocalciferol, (DRISDOL) 50000 UNITS CAPS capsule Take 50,000 Units by mouth every 7 (seven) days.   Yes Historical Provider, MD  zolpidem (AMBIEN) 10 MG tablet Take 10 mg by mouth at bedtime.   Yes Historical Provider, MD  oxyCODONE-acetaminophen (PERCOCET) 7.5-325 MG per tablet Take one or two tablets by mouth every 4-6 hours as needed for pain Patient not taking: Reported on 08/30/2016 08/06/14   Kermit Balo, DO    Review of Systems:  Constitutional:  No weight loss, night sweats, Fevers, chills, fatigue.  Head&Eyes: No headache.  No vision loss.  No eye pain or scotoma ENT:  No Difficulty swallowing,Tooth/dental problems,Sore throat,  No ear ache, post nasal drip,  Cardio-vascular:  No chest pain, Orthopnea, PND, swelling in lower extremities,  dizziness, palpitations  GI:  No  abdominal pain, nausea, vomiting, diarrhea, loss of appetite, hematochezia, melena, heartburn, indigestion, Resp:  No shortness of breath with exertion or at rest. No cough. No coughing up of blood .No wheezing.No chest wall deformity  Skin:  no rash or lesions.  GU:  no dysuria, change in color of urine, no urgency or frequency. No flank pain.  Musculoskeletal:  No joint pain or swelling. No decreased range of motion. No back pain.  Psych:  No change in mood or affect. No depression or anxiety. Neurologic: No headache, no dysesthesia, no focal weakness, no vision loss. No syncope  Physical Exam: Vitals:   08/30/16 0600 08/30/16 0617 08/30/16 0653 08/30/16 0732  BP: (!) 117/49  (!) 132/48 (!) 119/45  Pulse: 63  98 66  Resp:   15 20  Temp:    98.6 F (37 C)  TempSrc:    Oral  SpO2: 97%  98% 99%  Weight:  98.9 kg (218 lb 0.6 oz)    Height:  5' (1.524 m)     General:  A&O x 3, NAD, nontoxic, pleasant/cooperative Head/Eye: No  conjunctival hemorrhage, no icterus, [place]/AT, No nystagmus ENT:  No icterus,  No thrush, good dentition, no pharyngeal exudate Neck:  No masses, no lymphadenpathy, no bruits CV:  RRR, no rub, no gallop, no S3 Lung:  CTAB, good air movement, no wheeze, no rhonchi Abdomen: soft/NT, +BS, nondistended, no peritoneal signs Ext: No cyanosis, No rashes, No petechiae, No lymphangitis, No edema Neuro: CNII-XII intact, strength 4/5 in bilateral upper and lower extremities, no dysmetria  Labs on Admission:  Basic Metabolic Panel:  Recent Labs Lab 08/30/16 0530  NA 134*  K 4.0  CL 97*  CO2 27  GLUCOSE  118*  BUN 30*  CREATININE 1.04*  CALCIUM 9.4   Liver Function Tests:  Recent Labs Lab 08/30/16 0530  AST 18  ALT 14  ALKPHOS 80  BILITOT 0.4  PROT 5.9*  ALBUMIN 3.1*   No results for input(s): LIPASE, AMYLASE in the last 168 hours. No results for input(s): AMMONIA in the last 168 hours. CBC:  Recent Labs Lab 08/30/16 0530  WBC 23.8*  NEUTROABS 19.7*  HGB 11.0*  HCT 33.4*  MCV 93.8  PLT 228   Coagulation Profile: No results for input(s): INR, PROTIME in the last 168 hours. Cardiac Enzymes:  Recent Labs Lab 08/30/16 0530  TROPONINI 0.03*   BNP: Invalid input(s): POCBNP CBG: No results for input(s): GLUCAP in the last 168 hours. Urine analysis:    Component Value Date/Time   COLORURINE YELLOW 08/30/2016 0645   APPEARANCEUR CLEAR 08/30/2016 0645   LABSPEC 1.011 08/30/2016 0645   PHURINE 5.0 08/30/2016 0645   GLUCOSEU NEGATIVE 08/30/2016 0645   HGBUR NEGATIVE 08/30/2016 0645   BILIRUBINUR NEGATIVE 08/30/2016 0645   KETONESUR NEGATIVE 08/30/2016 0645   PROTEINUR NEGATIVE 08/30/2016 0645   NITRITE NEGATIVE 08/30/2016 0645   LEUKOCYTESUR NEGATIVE 08/30/2016 0645   Sepsis Labs: @LABRCNTIP (procalcitonin:4,lacticidven:4) )No results found for this or any previous visit (from the past 240 hour(s)).   Radiological Exams on Admission: Dg Chest 2 View  Result  Date: 08/30/2016 CLINICAL DATA:  Fever, hypoxia this morning.  Recent pneumonia. EXAM: CHEST  2 VIEW COMPARISON:  Chest radiograph August 14, 2016 FINDINGS: Cardiac silhouette is moderately enlarged unchanged. Mediastinal silhouette is nonsuspicious. Similar pulmonary vascular congestion without pleural effusion or focal consolidation. No pneumothorax. Osteopenia. Mild degenerative change of thoracic and lumbar spine. IMPRESSION: Stable cardiomegaly and pulmonary vascular congestion. Electronically Signed   By: Awilda Metroourtnay  Bloomer M.D.   On: 08/30/2016 05:36    EKG: Independently reviewed. Sinus rhythm, nonspecific T-wave change    Time spent:70 minutes Code Status:   FULL Family Communication:  Spouse and son updated at bedside Disposition Plan: expect 2-3 day hospitalization Consults called: [facility] DVT Prophylaxis:   SCDs  Heather Chern, DO  [facility] Pager [phone]  If 7PM-7AM, please contact night-coverage [email or web address] Password [email or web address] 08/30/2016, 9:00 AM

## 2016-08-30 NOTE — ED Notes (Signed)
Vascular Doppler Ultrasound at bedside

## 2016-08-30 NOTE — Progress Notes (Signed)
Social Work aware of consult for SNF placement. PT will need to work with patient and make recommendations for placement.   Will continue to follow.

## 2016-08-30 NOTE — Progress Notes (Signed)
Pharmacy Antibiotic Note  Heather Walters is a 83 y.o. female admitted on 08/30/2016 with pneumonia.  Pharmacy has been consulted for cefepime and vancomycin dosing.  Plan: Cefepime 1 Gm IV q12h Vancomycin 1 Gm x1 then 750 mg IV q12h VT=15-20 mg/L     Temp (24hrs), Avg:100.8 F (38.2 C), Min:100.8 F (38.2 C), Max:100.8 F (38.2 C)  No results for input(s): WBC, CREATININE, LATICACIDVEN, VANCOTROUGH, VANCOPEAK, VANCORANDOM, GENTTROUGH, GENTPEAK, GENTRANDOM, TOBRATROUGH, TOBRAPEAK, TOBRARND, AMIKACINPEAK, AMIKACINTROU, AMIKACIN in the last 168 hours.  CrCl cannot be calculated (No order found.).    Allergies  Allergen Reactions  . Codeine   . Lipitor [Atorvastatin]     Antimicrobials this admission: 1/3 cefepime >>  1/3 vancomycin >>   Dose adjustments this admission:   Microbiology results:  BCx:   UCx:    Sputum:    MRSA PCR:  Thank you for allowing pharmacy to be a part of this patient's care.  Lorenza EvangelistGreen, Myron Stankovich R 08/30/2016 5:14 AM

## 2016-08-30 NOTE — Progress Notes (Signed)
PHARMACIST - PHYSICIAN ORDER COMMUNICATION  CONCERNING: P&T Medication Policy on Herbal Medications  DESCRIPTION:  This patient's order for: Biotin has been noted.  This product(s) is classified as an "herbal" or natural product. Due to a lack of definitive safety studies or FDA approval, nonstandard manufacturing practices, plus the potential risk of unknown drug-drug interactions while on inpatient medications, the Pharmacy and Therapeutics Committee does not permit the use of "herbal" or natural products of this type within [facility].   ACTION TAKEN: The pharmacy department is unable to verify this order at this time and your patient has been informed of this safety policy. Please reevaluate patient's clinical condition at discharge and address if the herbal or natural product(s) should be resumed at that time.   Greer PickerelJigna Taneesha Edgin, PharmD, BCPS Pager: [phone] 08/30/2016 11:03 AM

## 2016-08-30 NOTE — ED Notes (Signed)
Bed: [id number] Expected date:  Expected time:  Means of arrival:  Comments: EMS 83 yo female 102 temp

## 2016-08-30 NOTE — ED Notes (Signed)
ED Provider at bedside. EDP [facility] PRESENT SPEAKING WITH PT AND FAMILY

## 2016-08-31 DIAGNOSIS — R509 Fever, unspecified: Secondary | ICD-10-CM

## 2016-08-31 DIAGNOSIS — R531 Weakness: Secondary | ICD-10-CM

## 2016-08-31 DIAGNOSIS — I10 Essential (primary) hypertension: Secondary | ICD-10-CM

## 2016-08-31 DIAGNOSIS — I5033 Acute on chronic diastolic (congestive) heart failure: Secondary | ICD-10-CM

## 2016-08-31 LAB — BASIC METABOLIC PANEL
Anion gap: 7 (ref 5–15)
BUN: 31 mg/dL — ABNORMAL HIGH (ref 6–20)
CO2: 30 mmol/L (ref 22–32)
CREATININE: 0.88 mg/dL (ref 0.44–1.00)
Calcium: 9.2 mg/dL (ref 8.9–10.3)
Chloride: 99 mmol/L — ABNORMAL LOW (ref 101–111)
GFR, EST NON AFRICAN AMERICAN: 60 mL/min — AB (ref >60)
Glucose, Bld: 130 mg/dL — ABNORMAL HIGH (ref 65–99)
Potassium: 3.9 mmol/L (ref 3.5–5.1)
SODIUM: 136 mmol/L (ref 135–145)

## 2016-08-31 LAB — CBC
HCT: 31.2 % — ABNORMAL LOW (ref 36.0–46.0)
Hemoglobin: 10.2 g/dL — ABNORMAL LOW (ref 12.0–15.0)
MCH: 31 pg (ref 26.0–34.0)
MCHC: 32.7 g/dL (ref 30.0–36.0)
MCV: 94.8 fL (ref 78.0–100.0)
PLATELETS: 199 10*3/uL (ref 150–400)
RBC: 3.29 MIL/uL — AB (ref 3.87–5.11)
RDW: 13.3 % (ref 11.5–15.5)
WBC: 12.6 10*3/uL — ABNORMAL HIGH (ref 4.0–10.5)

## 2016-08-31 MED ORDER — FUROSEMIDE 10 MG/ML IJ SOLN
40.0000 mg | Freq: Two times a day (BID) | INTRAMUSCULAR | Status: AC
  Administered 2016-08-31: 40 mg via INTRAVENOUS
  Filled 2016-08-31: qty 4

## 2016-08-31 MED ORDER — POLYETHYLENE GLYCOL 3350 17 G PO PACK
17.0000 g | PACK | Freq: Every day | ORAL | Status: DC
  Administered 2016-09-01 – 2016-09-02 (×2): 17 g via ORAL
  Filled 2016-08-31 (×3): qty 1

## 2016-08-31 NOTE — Progress Notes (Signed)
[facility] PROGRESS NOTE    Progress Note  Heather Walters  ZOX:[id number] DOB: [birth year] DOA: 08/30/2016 PCP: Margaree Mackintosh, MD     Brief Narrative:   Heather Walters is an 83 y.o. female past medical history of chronic diastolic heart failure, hypertension hyperlipidemia presents with 1-2 day history of generalized weakness, cough and shortness of breath, patient started developing fever of 102.0 Fahrenheit on 08/29/2016, she was recently discharged from [facility] after a stay last month she was treated for acute respiratory failure due to heart failure or pneumonia, was discharged with some estimated dry weight of 214 pounds  Assessment/Plan:   Acute on chronic diastolic CHF (congestive heart failure) (HCC): Weight on admission was 218, she was fluid restricted and start her on IV Lasix twice a day and continue on Coreg and Imdur Continue fluid restriction daily standing weights. Continue IV Lasix and reevaluate on a daily basis, cannot evaluate volume status due to her body habitus.  Fever of unknown source and Leukocytosis: Started empirically on IV vancomycin and cefepime. Prcalcitonin significantly elevated, cytosis improve with empiric antibiotics. Culture data has remained negative till date. We'll continue to follow. Question of this due to acute bronchitis versus pneumonia.  Atypical chest pain: CT angiogram of the chest on December 2014 was negative for PE, EKG showed nonspecific changes. Cardiac biomarkers remain flat. She is chest pain-free .  Lower extremity edema with pain: It was duplex is negative for DVT.  Hematochezia: Baseline hemoglobin is around 12, she is on aspirin for recent stroke about one month prior to admission. GI was consulted and recommended conservative management and follow-up as an outpatient with her GI [facility] as this may be due to hemorrhoids. Resume aspirin.  Essential  Hypertension Continue hold  antihypertensive medication, continue ACE inhibitor and Coreg.  Fibromyalgia/chronic pain: Continue current home regimen.  Recent stroke: PT OT continue aspirin.   DVT prophylaxis: lovenox Family Communication:none Disposition Plan/Barrier to D/C: home in 2-3 days Code Status:     Code Status Orders        Start     Ordered   08/30/16 1050  Full code  Continuous     08/30/16 1049    Code Status History    Date Active Date Inactive Code Status Order ID Comments User Context   This patient has a current code status but no historical code status.        IV Access:    Peripheral IV   Procedures and diagnostic studies:   Dg Chest 2 View  Result Date: 08/30/2016 CLINICAL DATA:  Fever, hypoxia this morning.  Recent pneumonia. EXAM: CHEST  2 VIEW COMPARISON:  Chest radiograph August 14, 2016 FINDINGS: Cardiac silhouette is moderately enlarged unchanged. Mediastinal silhouette is nonsuspicious. Similar pulmonary vascular congestion without pleural effusion or focal consolidation. No pneumothorax. Osteopenia. Mild degenerative change of thoracic and lumbar spine. IMPRESSION: Stable cardiomegaly and pulmonary vascular congestion. Electronically Signed   By: Awilda Metro M.D.   On: 08/30/2016 05:36     Medical Consultants:    None.  Anti-Infectives:   vanc and cefepime  Subjective:    Abbye Mayon she relates her breathing does not improve. She says she has no appetite  Objective:    Vitals:   08/30/16 1444 08/30/16 1524 08/30/16 2100 08/31/16 0412  BP: 116/78 (!) 151/52 (!) 147/45 (!) 134/46  Pulse: 77 69 64 65  Resp: 18 20 20 20   Temp: 98.1 F (36.7 C) 98.6 F (37 C)  98.6 F (37 C) 98.6 F (37 C)  TempSrc: Oral Oral Oral Oral  SpO2: 98% 98% 98% 97%  Weight:      Height:        Intake/Output Summary (Last 24 hours) at 08/31/16 0900 Last data filed at 08/31/16 0600  Gross per 24 hour  Intake              350 ml  Output                0 ml    Net              350 ml   Filed Weights   08/30/16 0617  Weight: 98.9 kg (218 lb 0.6 oz)    Exam: General exam: In no acute distress. Respiratory system: Good air movement and clear to auscultation. Cardiovascular system: S1 & S2 heard, RRR.  Gastrointestinal system: Abdomen is nondistended, soft and nontender.  Extremities: No pedal edema. Skin: No rashes, lesions or ulcers Psychiatry: Judgement and insight appear normal. Mood & affect appropriate.    Data Reviewed:    Labs: Basic Metabolic Panel:  Recent Labs Lab 08/30/16 0530 08/31/16 0443  NA 134* 136  K 4.0 3.9  CL 97* 99*  CO2 27 30  GLUCOSE 118* 130*  BUN 30* 31*  CREATININE 1.04* 0.88  CALCIUM 9.4 9.2   GFR Estimated Creatinine Clearance: 52.1 mL/min (by C-G formula based on SCr of 0.88 mg/dL). Liver Function Tests:  Recent Labs Lab 08/30/16 0530  AST 18  ALT 14  ALKPHOS 80  BILITOT 0.4  PROT 5.9*  ALBUMIN 3.1*   No results for input(s): LIPASE, AMYLASE in the last 168 hours. No results for input(s): AMMONIA in the last 168 hours. Coagulation profile No results for input(s): INR, PROTIME in the last 168 hours.  CBC:  Recent Labs Lab 08/30/16 0530 08/31/16 0443  WBC 23.8* 12.6*  NEUTROABS 19.7*  --   HGB 11.0* 10.2*  HCT 33.4* 31.2*  MCV 93.8 94.8  PLT 228 199   Cardiac Enzymes:  Recent Labs Lab 08/30/16 0530 08/30/16 1057 08/30/16 1620  TROPONINI 0.03* 0.03* 0.03*   BNP (last 3 results) No results for input(s): PROBNP in the last 8760 hours. CBG: No results for input(s): GLUCAP in the last 168 hours. D-Dimer: No results for input(s): DDIMER in the last 72 hours. Hgb A1c: No results for input(s): HGBA1C in the last 72 hours. Lipid Profile: No results for input(s): CHOL, HDL, LDLCALC, TRIG, CHOLHDL, LDLDIRECT in the last 72 hours. Thyroid function studies:  Recent Labs  08/30/16 1057  TSH 1.323   Anemia work up: No results for input(s): VITAMINB12, FOLATE,  FERRITIN, TIBC, IRON, RETICCTPCT in the last 72 hours. Sepsis Labs:  Recent Labs Lab 08/30/16 0530 08/30/16 0834 08/30/16 1057 08/31/16 0443  PROCALCITON  --   --  3.51  --   WBC 23.8*  --   --  12.6*  LATICACIDVEN 1.1 1.1  --   --    Microbiology Recent Results (from the past 240 hour(s))  MRSA PCR Screening     Status: None   Collection Time: 08/30/16  4:24 PM  Result Value Ref Range Status   MRSA by PCR NEGATIVE NEGATIVE Final    Comment:        The GeneXpert MRSA Assay (FDA approved for NASAL specimens only), is one component of a comprehensive MRSA colonization surveillance program. It is not intended to diagnose MRSA infection nor to guide or monitor  treatment for MRSA infections.      Medications:   . aspirin  81 mg Oral Daily  . carvedilol  6.25 mg Oral BID WC  . ceFEPime (MAXIPIME) IV  1 g Intravenous Q12H  . enalapril  10 mg Oral Daily  . escitalopram  20 mg Oral Daily  . ferrous sulfate  325 mg Oral Q breakfast  . furosemide  40 mg Intravenous BID  . isosorbide mononitrate  60 mg Oral Daily  . LORazepam  0.5 mg Oral BID  . multivitamin with minerals  1 tablet Oral Daily  . oxybutynin  10 mg Oral QHS  . pantoprazole  80 mg Oral Daily  . rOPINIRole  0.5 mg Oral QHS  . rosuvastatin  20 mg Oral q1800  . sodium chloride flush  3 mL Intravenous Q12H  . traZODone  50 mg Oral QHS  . vancomycin  750 mg Intravenous Q12H   Continuous Infusions:  Time spent: 25 min   LOS: 1 day   Marinda ElkFELIZ ORTIZ, Tyshia Fenter  [facility] Pager [phone]  *Please refer to [email or web address], password [email or web address] to get updated schedule on who will round on this patient, as hospitalists switch teams weekly. If 7PM-7AM, please contact night-coverage at [email or web address], password [email or web address] for any overnight needs.  08/31/2016, 9:00 AM

## 2016-08-31 NOTE — Progress Notes (Signed)
Nutrition Education Note  RD consulted for nutrition education regarding new onset CHF. Pt also has hx of essential HTN.   RD provided "Heart Failure Nutrition Therapy," "Sodium Content of Food," and "Sodium Free Flavoring Tips" handouts from the [employer]. Reviewed patient's dietary recall. Provided examples on ways to decrease sodium intake in diet. Discouraged intake of processed foods and use of salt shaker. Encouraged fresh fruits and vegetables as well as whole grain sources of carbohydrates to maximize fiber intake.   RD discussed why it is important for patient to adhere to diet recommendations, and emphasized the role of fluids, foods to avoid, and importance of weighing self daily. Teach back method used.  Pt reports that she sometimes adds salt to foods when cooking. Her husband, who was in the room during RD visit, often does the grocery shopping. Reviewed handouts with both of them in detail. Pt and husband state that they had been discussing this topic at home but were unsure what to do/where to start and are very appreciative of RD visit. All current questions answered and encouraged them to alert RN if they would like RD to re-visit prior to d/c.  Expect good compliance.  Body mass index is 42.58 kg/m. Pt meets criteria for morbid obesity based on current BMI.  Current diet order is Heart Healthy. Pt reports decreased appetite and intakes since getting sick. Labs and medications reviewed. No further nutrition interventions warranted at this time. RD contact information provided. If additional nutrition issues arise, please re-consult RD.     Trenton GammonJessica Arianis Bowditch, MS, RD, LDN, [facility] Inpatient Clinical Dietitian Pager # [phone] After hours/weekend pager # [phone]

## 2016-08-31 NOTE — Evaluation (Signed)
Physical Therapy Evaluation Patient Details Name: Heather Walters MRN: [id number] DOB: Nov 14, 1931 Today's Date: 08/31/2016   History of Present Illness  83 y.o. female past medical history of chronic diastolic heart failure, hypertension, hyperlipidemia, stroke, neuropathy and admitted for acute on chronic diastolic heart failure  Clinical Impression  Pt admitted with above diagnosis. Pt currently with functional limitations due to the deficits listed below (see PT Problem List).  Pt will benefit from skilled PT to increase their independence and safety with mobility to allow discharge to the venue listed below.   Pt assisted OOB and ambulated in hallway.  Pt mobilizes well and has spouse at home.  Pt reports she is mainly in the house and spouse does all community needs such as grocery shopping.  Pt reports fall prior to admission and unable to recall cause.  Pt would benefit from HHPT upon d/c.     Follow Up Recommendations Home health PT;Supervision - Intermittent    Equipment Recommendations  None recommended by PT    Recommendations for Other Services       Precautions / Restrictions Precautions Precautions: Fall      Mobility  Bed Mobility Overal bed mobility: Needs Assistance Bed Mobility: Supine to Sit     Supine to sit: Min assist     General bed mobility comments: provides hands for pt to self assist trunk upright  Transfers Overall transfer level: Needs assistance Equipment used: Rolling walker (2 wheeled) Transfers: Sit to/from Stand Sit to Stand: Min guard         General transfer comment: verbal cues for safe technique  Ambulation/Gait Ambulation/Gait assistance: Min guard Ambulation Distance (Feet): 120 Feet Assistive device: Rolling walker (2 wheeled) Gait Pattern/deviations: Step-through pattern;Decreased stride length     General Gait Details: pt steady with RW, distance to tolerance  Stairs            Wheelchair Mobility    Modified  Rankin (Stroke Patients Only)       Balance Overall balance assessment: History of Falls (reports fall PTA, unknown cause per pt)                                           Pertinent Vitals/Pain Pain Assessment: No/denies pain (c/o nausea, RN in to give meds)    Home Living Family/patient expects to be discharged to:: Private residence Living Arrangements: Spouse/significant other   Type of Home: House Home Access: Level entry     Home Layout: One level Home Equipment: Environmental consultantWalker - 4 wheels;Cane - single point      Prior Function Level of Independence: Independent with assistive device(s)         Comments: reports ambulating with rollator or SPC in house, typically does not go into community     Hand Dominance        Extremity/Trunk Assessment        Lower Extremity Assessment Lower Extremity Assessment: Generalized weakness       Communication   Communication: No difficulties  Cognition Arousal/Alertness: Awake/alert Behavior During Therapy: WFL for tasks assessed/performed Overall Cognitive Status: Within Functional Limits for tasks assessed                      General Comments      Exercises     Assessment/Plan    PT Assessment Patient needs continued PT services  PT Problem  List Decreased strength;Decreased mobility;Decreased knowledge of use of DME;Decreased activity tolerance;Decreased balance          PT Treatment Interventions DME instruction;Gait training;Therapeutic activities;Therapeutic exercise;Functional mobility training;Balance training;Patient/family education    PT Goals (Current goals can be found in the Care Plan section)  Acute Rehab PT Goals PT Goal Formulation: With patient Time For Goal Achievement: 09/14/16 Potential to Achieve Goals: Good    Frequency Min 3X/week   Barriers to discharge        Co-evaluation               End of Session Equipment Utilized During Treatment: Gait  belt Activity Tolerance: Patient limited by fatigue Patient left: in chair;with call bell/phone within reach;with chair alarm set;with family/visitor present Nurse Communication: Mobility status         Time: 4098-1191 PT Time Calculation (min) (ACUTE ONLY): 22 min   Charges:   PT Evaluation $PT Eval Moderate Complexity: 1 Procedure     PT G Codes:        Heather Walters,Heather Walters 08/31/2016, 2:50 PM Zenovia Jarred, PT, DPT 08/31/2016 Pager: [phone]

## 2016-08-31 NOTE — Progress Notes (Signed)
PT Cancellation Note  Patient Details Name: Heather Walters MRN: [id number] DOB: 01/24/1932   Cancelled Treatment:    Reason Eval/Treat Not Completed: Other (comment) (pt eating breakfast) Pt requests PT check back later.   [name],[name] 08/31/2016, 10:41 AM Zenovia JarredKati Demiya Magno, PT, DPT 08/31/2016 Pager: [phone]

## 2016-08-31 NOTE — Evaluation (Signed)
Occupational Therapy Evaluation Patient Details Name: Heather Walters MRN: [id number] DOB: 11-Oct-1931 Today's Date: 08/31/2016    History of Present Illness 83 y.o. female past medical history of chronic diastolic heart failure, hypertension, hyperlipidemia, stroke, neuropathy and admitted for acute on chronic diastolic heart failure   Clinical Impression   Pt was admitted for the above. She will benefit from continued OT in acute setting as well as follow up OT to increase safety and independence with adls. Pt was recently at rehab, and she reports that husband helps with socks/shoes.  Otherwise, she has been mod I with adls.      Follow Up Recommendations  Home health OT;Supervision/Assistance - 24 hour (as long as pt progresses well)    Equipment Recommendations  3 in 1 bedside commode (if pt doesn't have this)    Recommendations for Other Services       Precautions / Restrictions Precautions Precautions: Fall Restrictions Weight Bearing Restrictions: No      Mobility Bed Mobility Overal bed mobility: Needs Assistance Bed Mobility: Supine to Sit      Sit to supine: Mod assist   General bed mobility comments: assist for bil LEs, +2 assist to reposition in bed  Transfers Overall transfer level: Needs assistance Equipment used: Rolling walker (2 wheeled) Transfers: Sit to/from Stand Sit to Stand: Min guard;Min assist         General transfer comment: min A from recliner, min guard from 3:1 commode.  Cues for UE placement    Balance Overall balance assessment: History of Falls                                          ADL Overall ADL's : Needs assistance/impaired             Lower Body Bathing: Moderate assistance;Sit to/from stand       Lower Body Dressing: Maximal assistance;Sit to/from stand   Toilet Transfer: Minimal assistance;Ambulation;BSC;RW   Toileting- Clothing Manipulation and Hygiene: Minimal assistance;Sit to/from  stand         General ADL Comments: pt fatiqued and wanted to get back to bed. Ambulated to use 3:1 commode first.  She can perform UB adls with set up     Vision     Perception     Praxis      Pertinent Vitals/Pain Pain Assessment: Faces Faces Pain Scale: Hurts little more Pain Location: abdomen Pain Descriptors / Indicators: Aching Pain Intervention(s): Limited activity within patient's tolerance;Monitored during session;Repositioned     Hand Dominance     Extremity/Trunk Assessment Upper Extremity Assessment Upper Extremity Assessment: Generalized weakness          Communication Communication Communication: No difficulties   Cognition Arousal/Alertness: Awake/alert Behavior During Therapy: WFL for tasks assessed/performed Overall Cognitive Status: Within Functional Limits for tasks assessed                     General Comments       Exercises       Shoulder Instructions      Home Living Family/patient expects to be discharged to:: Private residence Living Arrangements: Spouse/significant other Available Help at Discharge: Family Type of Home: House Home Access: Level entry     Home Layout: One level               Home Equipment: Environmental consultantWalker - 4 wheels;Cane - single  point          Prior Functioning/Environment Level of Independence: Needs assistance    ADL's / Homemaking Assistance Needed: husband assists with socks/shoes   Comments: reports ambulating with rollator or SPC in house, typically does not go into community        OT Problem List: Decreased strength;Decreased activity tolerance;Impaired balance (sitting and/or standing);Decreased knowledge of use of DME or AE;Pain   OT Treatment/Interventions: Self-care/ADL training;DME and/or AE instruction;Patient/family education;Therapeutic activities;Energy conservation    OT Goals(Current goals can be found in the care plan section) Acute Rehab OT Goals Patient Stated Goal:  none stated OT Goal Formulation: With patient Time For Goal Achievement: 09/07/16 Potential to Achieve Goals: Good ADL Goals Pt Will Transfer to Toilet: with supervision;ambulating;bedside commode Pt Will Perform Toileting - Clothing Manipulation and hygiene: with supervision;sit to/from stand Pt Will Perform Tub/Shower Transfer: with supervision;ambulating;shower seat Additional ADL Goal #1: pt will initiate at least one rest break during adls for energy conservation  OT Frequency: Min 2X/week   Barriers to D/C:            Co-evaluation              End of Session    Activity Tolerance: Patient tolerated treatment well Patient left: in bed;with call bell/phone within reach;with bed alarm set;with family/visitor present   Time: 5188-4166 OT Time Calculation (min): 19 min Charges:  OT General Charges $OT Visit: 1 Procedure OT Evaluation $OT Eval Moderate Complexity: 1 Procedure G-Codes:    Jestine Bicknell 2016-09-14, 3:42 PM  Marica Otter, OTR/L [phone] 09-14-16

## 2016-09-01 LAB — URINE CULTURE

## 2016-09-01 LAB — BASIC METABOLIC PANEL
ANION GAP: 8 (ref 5–15)
BUN: 26 mg/dL — ABNORMAL HIGH (ref 6–20)
CALCIUM: 9.5 mg/dL (ref 8.9–10.3)
CO2: 31 mmol/L (ref 22–32)
Chloride: 100 mmol/L — ABNORMAL LOW (ref 101–111)
Creatinine, Ser: 0.75 mg/dL (ref 0.44–1.00)
Glucose, Bld: 106 mg/dL — ABNORMAL HIGH (ref 65–99)
POTASSIUM: 4.1 mmol/L (ref 3.5–5.1)
Sodium: 139 mmol/L (ref 135–145)

## 2016-09-01 LAB — PROCALCITONIN: PROCALCITONIN: 1.08 ng/mL

## 2016-09-01 MED ORDER — ENALAPRIL MALEATE 10 MG PO TABS
10.0000 mg | ORAL_TABLET | Freq: Every day | ORAL | Status: DC
  Administered 2016-09-01 – 2016-09-02 (×2): 10 mg via ORAL
  Filled 2016-09-01 (×2): qty 1

## 2016-09-01 MED ORDER — FUROSEMIDE 10 MG/ML IJ SOLN
60.0000 mg | Freq: Two times a day (BID) | INTRAMUSCULAR | Status: DC
  Filled 2016-09-01: qty 6

## 2016-09-01 MED ORDER — FUROSEMIDE 40 MG PO TABS: 40.0000 mg | ORAL_TABLET | Freq: Every day | ORAL | Status: DC | PRN

## 2016-09-01 MED ORDER — HYDROCHLOROTHIAZIDE 25 MG PO TABS
25.0000 mg | ORAL_TABLET | Freq: Every day | ORAL | Status: DC
Start: 2016-09-01 — End: 2016-09-02
  Administered 2016-09-01 – 2016-09-02 (×2): 25 mg via ORAL
  Filled 2016-09-01 (×2): qty 1

## 2016-09-01 MED ORDER — FUROSEMIDE 40 MG PO TABS
40.0000 mg | ORAL_TABLET | Freq: Every day | ORAL | Status: DC
  Administered 2016-09-01 – 2016-09-02 (×2): 40 mg via ORAL
  Filled 2016-09-01 (×2): qty 1

## 2016-09-01 MED ORDER — ENALAPRIL-HYDROCHLOROTHIAZIDE 10-25 MG PO TABS: 1.0000 | ORAL_TABLET | Freq: Every day | ORAL | Status: DC

## 2016-09-01 NOTE — Progress Notes (Signed)
Occupational Therapy Treatment Patient Details Name: Heather Walters MRN: [id number] DOB: May 24, 1932 Today's Date: 09/01/2016    History of present illness 83 y.o. female past medical history of chronic diastolic heart failure, hypertension, hyperlipidemia, stroke, neuropathy and admitted for acute on chronic diastolic heart failure   OT comments  Pt much improved from yesterday.  Worked on ADL and transfer to commode this session  Follow Up Recommendations  Home health OT;Supervision/Assistance - 24 hour    Equipment Recommendations  3 in 1 bedside commode (husband would like to talk to [facility] about cost)    Recommendations for Other Services      Precautions / Restrictions Precautions Precautions: Fall Restrictions Weight Bearing Restrictions: No       Mobility Bed Mobility         Supine to sit: Min guard (used rail) Sit to supine: Min assist   General bed mobility comments: assist for legs getting back into bed  Transfers   Equipment used: Rolling walker (2 wheeled)   Sit to Stand: Min guard         General transfer comment: for safety    Balance Overall balance assessment: History of Falls                                 ADL               Lower Body Bathing: Minimal assistance;Sit to/from stand           Toilet Transfer: Min guard;Ambulation;BSC;RW             General ADL Comments: performed ADL from EOB and pt sat on commode without success.  She sponge bathes at home.  Pt feels much better than yesterday and did not need rest breakes      Vision                     Perception     Praxis      Cognition   Behavior During Therapy: [facility] for tasks assessed/performed Overall Cognitive Status: Within Functional Limits for tasks assessed                       Extremity/Trunk Assessment               Exercises     Shoulder Instructions       General Comments      Pertinent Vitals/  Pain       Pain Assessment: No/denies pain  Home Living                                          Prior Functioning/Environment              Frequency  Min 2X/week        Progress Toward Goals  OT Goals(current goals can now be found in the care plan section)  Progress towards OT goals: Progressing toward goals  Acute Rehab OT Goals Patient Stated Goal: hopes to go home this weekend  Plan      Co-evaluation                 End of Session     Activity Tolerance Patient tolerated treatment well   Patient Left in bed;with call bell/phone within reach;with bed alarm  set;with family/visitor present   Nurse Communication          Time: [phone] OT Time Calculation (min): 27 min  Charges: OT General Charges $OT Visit: 1 Procedure OT Treatments $Self Care/Home Management : 23-37 mins  Casandra Dallaire 09/01/2016, 12:03 PM  Marica Otter, OTR/L [phone] 09/01/2016

## 2016-09-01 NOTE — Progress Notes (Signed)
Pt was active with [facility] and will continue at discharge.

## 2016-09-01 NOTE — Progress Notes (Signed)
Physical Therapy Treatment Patient Details Name: Heather Walters MRN: [id number] DOB: May 16, 1932 Today's Date: 09/01/2016    History of Present Illness 83 y.o. female past medical history of chronic diastolic heart failure, hypertension, hyperlipidemia, stroke, neuropathy and admitted for acute on chronic diastolic heart failure    PT Comments    Pt assisted to bathroom and then ambulated in hallway.  Pt reports feeling better today.  Follow Up Recommendations  Home health PT;Supervision - Intermittent     Equipment Recommendations  None recommended by PT    Recommendations for Other Services       Precautions / Restrictions Precautions Precautions: Fall    Mobility  Bed Mobility Overal bed mobility: Needs Assistance Bed Mobility: Supine to Sit;Sit to Supine     Supine to sit: Supervision;HOB elevated Sit to supine: Min assist   General bed mobility comments: utilized rail for OOB, required assist for LEs onto bed  Transfers Overall transfer level: Needs assistance Equipment used: Rolling walker (2 wheeled) Transfers: Sit to/from Stand Sit to Stand: Min guard         General transfer comment: min/guard from bed, min assist from toilet, pt reports she has elevated toilets at home  Ambulation/Gait Ambulation/Gait assistance: Min guard Ambulation Distance (Feet): 120 Feet Assistive device: Rolling walker (2 wheeled) Gait Pattern/deviations: Step-through pattern;Decreased stride length     General Gait Details: pt steady with RW, distance to tolerance   Stairs            Wheelchair Mobility    Modified Rankin (Stroke Patients Only)       Balance                                    Cognition Arousal/Alertness: Awake/alert Behavior During Therapy: WFL for tasks assessed/performed Overall Cognitive Status: Within Functional Limits for tasks assessed                      Exercises      General Comments         Pertinent Vitals/Pain Pain Assessment: No/denies pain    Home Living                      Prior Function            PT Goals (current goals can now be found in the care plan section) Progress towards PT goals: Progressing toward goals    Frequency    Min 3X/week      PT Plan Current plan remains appropriate    Co-evaluation             End of Session Equipment Utilized During Treatment: Gait belt Activity Tolerance: Patient limited by fatigue Patient left: with call bell/phone within reach;with family/visitor present;in bed     Time: 1411-1431 PT Time Calculation (min) (ACUTE ONLY): 20 min  Charges:  $Gait Training: 8-22 mins                    G Codes:      Heather Walters,Heather Walters 09/01/2016, 4:15 PM Heather JarredKati Jamaurie Walters, PT, DPT 09/01/2016 Pager: [phone]

## 2016-09-01 NOTE — Progress Notes (Signed)
[facility] PROGRESS NOTE    Progress Note  Heather Milothalee Laible  NWG:[id number] DOB: 09/24/1931 DOA: 08/30/2016 PCP: Margaree MackintoshMCKINNEY, JOHN, MD     Brief Narrative:   Heather Walters is an 83 y.o. female past medical history of chronic diastolic heart failure, hypertension hyperlipidemia presents with 1-2 day history of generalized weakness, cough and shortness of breath, patient started developing fever of 102.0 Fahrenheit on 08/29/2016, [facility] was [facility] after a stay last month she was treated for acute respiratory failure due to heart failure or pneumonia, was discharged with some estimated dry weight of 214 pounds  Assessment/Plan:   Acute on chronic diastolic CHF (congestive heart failure) (HCC): Weight on admission was 218, change lasix to oral and add home dose HCTZ and ACE-I. Continue fluid restriction, weight today 210.  Fever of unknown source and Leukocytosis: Started empirically on IV vancomycin and cefepime. Cont  IV empiric antibiotics. U. Culture showed VRE, pt is asx. Question of this due to acute bronchitis versus pneumonia.  Atypical chest pain: CT angiogram of the chest on December 2014 was negative for PE, EKG showed nonspecific changes. Cardiac biomarkers remain flat. She is chest pain-free .  Lower extremity edema with pain: It was duplex is negative for DVT.  Hematochezia: Baseline hemoglobin is around 12, she is on aspirin for recent stroke about one month prior to admission. GI was consulted and recommended conservative management and follow-up as an outpatient with her GI doctor [facility] as this may be due to hemorrhoids. Resume aspirin. hbg stable.  Essential  Hypertension Resume continue ACE inhibitor and HCTZ, cont Coreg.  Fibromyalgia/chronic pain: Continue current home regimen.  Recent stroke: PT OT continue aspirin.   DVT prophylaxis: lovenox Family Communication:none Disposition Plan/Barrier to D/C: home  in 2  days Code Status:     Code Status Orders        Start     Ordered   08/30/16 1050  Full code  Continuous     08/30/16 1049    Code Status History    Date Active Date Inactive Code Status Order ID Comments User Context   This patient has a current code status but no historical code status.        IV Access:    Peripheral IV   Procedures and diagnostic studies:   No results found.   Medical Consultants:    None.  Anti-Infectives:   vanc and cefepime  Subjective:    Tamu Zwiefelhofer Appetite improved, breathing is improved compare to yesterday.  Objective:    Vitals:   08/31/16 2022 09/01/16 0400 09/01/16 0500 09/01/16 0945  BP: (!) 141/41 (!) 139/58    Pulse: 73 71    Resp: 16 17    Temp: 98.1 F (36.7 C) 98.2 F (36.8 C)    TempSrc: Oral Oral    SpO2: 91% 97%    Weight:   101.4 kg (223 lb 8 oz) 95.3 kg (210 lb 3.2 oz)  Height:        Intake/Output Summary (Last 24 hours) at 09/01/16 1134 Last data filed at 09/01/16 0500  Gross per 24 hour  Intake              880 ml  Output             1200 ml  Net             -320 ml   Filed Weights   08/30/16 0617 09/01/16 0500 09/01/16 0945  Weight: 98.9 kg (218 lb 0.6 oz) 101.4 kg (223 lb 8 oz) 95.3 kg (210 lb 3.2 oz)    Exam: General exam: In no acute distress. Respiratory system: Good air movement and clear to auscultation. Cardiovascular system: S1 & S2 heard, RRR.  Gastrointestinal system: Abdomen is nondistended, soft and nontender.  Extremities: No pedal edema. Skin: No rashes, lesions or ulcers Psychiatry: Judgement and insight appear normal. Mood & affect appropriate.    Data Reviewed:    Labs: Basic Metabolic Panel:  Recent Labs Lab 08/30/16 0530 08/31/16 0443 09/01/16 0647  NA 134* 136 139  K 4.0 3.9 4.1  CL 97* 99* 100*  CO2 27 30 31   GLUCOSE 118* 130* 106*  BUN 30* 31* 26*  CREATININE 1.04* 0.88 0.75  CALCIUM 9.4 9.2 9.5   GFR Estimated Creatinine Clearance: 56  mL/min (by C-G formula based on SCr of 0.75 mg/dL). Liver Function Tests:  Recent Labs Lab 08/30/16 0530  AST 18  ALT 14  ALKPHOS 80  BILITOT 0.4  PROT 5.9*  ALBUMIN 3.1*   No results for input(s): LIPASE, AMYLASE in the last 168 hours. No results for input(s): AMMONIA in the last 168 hours. Coagulation profile No results for input(s): INR, PROTIME in the last 168 hours.  CBC:  Recent Labs Lab 08/30/16 0530 08/31/16 0443  WBC 23.8* 12.6*  NEUTROABS 19.7*  --   HGB 11.0* 10.2*  HCT 33.4* 31.2*  MCV 93.8 94.8  PLT 228 199   Cardiac Enzymes:  Recent Labs Lab 08/30/16 0530 08/30/16 1057 08/30/16 1620  TROPONINI 0.03* 0.03* 0.03*   BNP (last 3 results) No results for input(s): PROBNP in the last 8760 hours. CBG: No results for input(s): GLUCAP in the last 168 hours. D-Dimer: No results for input(s): DDIMER in the last 72 hours. Hgb A1c: No results for input(s): HGBA1C in the last 72 hours. Lipid Profile: No results for input(s): CHOL, HDL, LDLCALC, TRIG, CHOLHDL, LDLDIRECT in the last 72 hours. Thyroid function studies:  Recent Labs  08/30/16 1057  TSH 1.323   Anemia work up: No results for input(s): VITAMINB12, FOLATE, FERRITIN, TIBC, IRON, RETICCTPCT in the last 72 hours. Sepsis Labs:  Recent Labs Lab 08/30/16 0530 08/30/16 0834 08/30/16 1057 08/31/16 0443 09/01/16 0647  PROCALCITON  --   --  3.51  --  1.08  WBC 23.8*  --   --  12.6*  --   LATICACIDVEN 1.1 1.1  --   --   --    Microbiology Recent Results (from the past 240 hour(s))  Culture, blood (routine x 2)     Status: None (Preliminary result)   Collection Time: 08/30/16  5:30 AM  Result Value Ref Range Status   Specimen Description BLOOD RIGHT HAND  Final   Special Requests BOTTLES DRAWN AEROBIC AND ANAEROBIC  Final   Culture   Final    NO GROWTH 1 DAY Performed at [facility]    Report Status PENDING  Incomplete  Culture, blood (routine x 2)     Status: None  (Preliminary result)   Collection Time: 08/30/16  5:50 AM  Result Value Ref Range Status   Specimen Description BLOOD RIGHT FOREARM  Final   Special Requests BOTTLES DRAWN AEROBIC AND ANAEROBIC  Final   Culture   Final    NO GROWTH 1 DAY Performed at [facility]    Report Status PENDING  Incomplete  Urine culture     Status: Abnormal   Collection  Time: 08/30/16  6:45 AM  Result Value Ref Range Status   Specimen Description URINE, CATHETERIZED  Final   Special Requests NONE  Final   Culture (A)  Final    >=100,000 COLONIES/mL VANCOMYCIN RESISTANT ENTEROCOCCUS   Report Status 09/01/2016 FINAL  Final  MRSA PCR Screening     Status: None   Collection Time: 08/30/16  4:24 PM  Result Value Ref Range Status   MRSA by PCR NEGATIVE NEGATIVE Final    Comment:        The GeneXpert MRSA Assay (FDA approved for NASAL specimens only), is one component of a comprehensive MRSA colonization surveillance program. It is not intended to diagnose MRSA infection nor to guide or monitor treatment for MRSA infections.      Medications:   . aspirin  81 mg Oral Daily  . carvedilol  6.25 mg Oral BID WC  . ceFEPime (MAXIPIME) IV  1 g Intravenous Q12H  . enalapril  10 mg Oral Daily   And  . hydrochlorothiazide  25 mg Oral Daily  . escitalopram  20 mg Oral Daily  . ferrous sulfate  325 mg Oral Q breakfast  . furosemide  40 mg Oral Daily  . isosorbide mononitrate  60 mg Oral Daily  . LORazepam  0.5 mg Oral BID  . multivitamin with minerals  1 tablet Oral Daily  . oxybutynin  10 mg Oral QHS  . pantoprazole  80 mg Oral Daily  . polyethylene glycol  17 g Oral Daily  . rOPINIRole  0.5 mg Oral QHS  . rosuvastatin  20 mg Oral q1800  . sodium chloride flush  3 mL Intravenous Q12H  . traZODone  50 mg Oral QHS   Continuous Infusions:  Time spent: 25 min   LOS: 2 days   Marinda Elk  [facility] Pager [phone]  *Please refer to [email or web address], password [email or web address] to get  updated schedule on who will round on this patient, as hospitalists switch teams weekly. If 7PM-7AM, please contact night-coverage at [email or web address], password [email or web address] for any overnight needs.  09/01/2016, 11:34 AM

## 2016-09-02 DIAGNOSIS — J189 Pneumonia, unspecified organism: Secondary | ICD-10-CM

## 2016-09-02 DIAGNOSIS — E784 Other hyperlipidemia: Secondary | ICD-10-CM

## 2016-09-02 LAB — BASIC METABOLIC PANEL
ANION GAP: 7 (ref 5–15)
BUN: 30 mg/dL — ABNORMAL HIGH (ref 6–20)
CALCIUM: 9.5 mg/dL (ref 8.9–10.3)
CO2: 32 mmol/L (ref 22–32)
Chloride: 97 mmol/L — ABNORMAL LOW (ref 101–111)
Creatinine, Ser: 0.93 mg/dL (ref 0.44–1.00)
GFR calc non Af Amer: 56 mL/min — ABNORMAL LOW (ref >60)
Glucose, Bld: 102 mg/dL — ABNORMAL HIGH (ref 65–99)
POTASSIUM: 4.5 mmol/L (ref 3.5–5.1)
Sodium: 136 mmol/L (ref 135–145)

## 2016-09-02 MED ORDER — VITAMINS A & D EX OINT
TOPICAL_OINTMENT | CUTANEOUS | Status: AC
  Administered 2016-09-02: 5
  Filled 2016-09-02: qty 5

## 2016-09-02 MED ORDER — LEVOFLOXACIN 500 MG PO TABS
500.0000 mg | ORAL_TABLET | ORAL | Status: DC
  Administered 2016-09-02: 500 mg via ORAL
  Filled 2016-09-02: qty 1

## 2016-09-02 MED ORDER — LEVOFLOXACIN 500 MG PO TABS: 500.0000 mg | ORAL_TABLET | ORAL | 0 refills | Status: DC

## 2016-09-02 MED ORDER — ZOLPIDEM TARTRATE 5 MG PO TABS: 5.0000 mg | ORAL_TABLET | Freq: Every evening | ORAL | 0 refills | Status: DC | PRN

## 2016-09-02 MED ORDER — OXYMETAZOLINE HCL 0.05 % NA SOLN
1.0000 | Freq: Two times a day (BID) | NASAL | Status: DC
  Administered 2016-09-02: 1 via NASAL
  Filled 2016-09-02: qty 15

## 2016-09-02 NOTE — Progress Notes (Signed)
Patient is stable for discharge. Discharge instructions and medications have been reviewed with the patient and her husband, and all questions answered. Reviewed Heart Failure Booklet and Zone Tool with the patient and her husband. They are waiting on their daughter to arrive to transport them home.   Arta BruceDeutsch, Brodey Bonn [facility], Incorporated[facility]ULER 09/02/2016 2:09 PM

## 2016-09-02 NOTE — Care Management Note (Signed)
Case Management Note  Patient Details  Name: Heather Walters MRN: [id number] Date of Birth: Apr 17, 1932  Subjective/Objective:   CHF, CAP,  Chest pain, HTN                Action/Plan: Discharge Planning: AVS reviewed:  NCM spoke to pt and husband at home. Requesting 3n1 bedside commode at home. Pt will need oxygen. Contacted [facility] DME rep for 3n1 and oxygen. Pt HH [facility] with [facility]. [facility].   PCP Margaree MackintoshMCKINNEY, JOHN MD    Expected Discharge Date:  09/02/2016              Expected Discharge Plan:  Home w Home Health Services  In-House Referral:  NA  Discharge planning Services  CM Consult  Post Acute Care Choice:  Home Health Choice offered to:  Spouse  DME Arranged:  3-N-1 DME Agency:  [facility]  HH Arranged:  RN, PT, NA. OT HH Agency:  [facility]  Status of Service:  Completed, signed off  If discussed at Long Length of Stay Meetings, dates discussed:    Additional Comments:  Elliot CousinShavis, Ruthanne Mcneish Ellen, RN 09/02/2016, 11:49 AM

## 2016-09-02 NOTE — Discharge Summary (Addendum)
Physician Discharge Summary  Heather Walters ZOX:[id number] DOB: Mar 25, 1932 DOA: 08/30/2016  PCP: Margaree Mackintosh, MD  Admit date: 08/30/2016 Discharge date: 09/02/2016  Admitted From: home Disposition:  Home  Recommendations for Outpatient Follow-up:  1. Follow up with PCP in 1-2 weeks 2. Please obtain BMP/CBC in one week  Home Health:Yes Equipment/Devices: bedside commode  Discharge Condition:Stable CODE STATUS:full Diet recommendation: Heart Healthy   Brief/Interim Summary: 83 y.o. female with medical history of diastolic CHF, hypertension, hyperlipidemia, fibromyalgia, GERD who presents with 1-2 day history of generalized weakness, cough, and shortness of breath. The patient developed a fever up to 102.56F on the evening of 08/29/2016. She was recently discharged from [facility] after a stay from 07/30/2016 through 08/04/2016 at which time she was treated for acute respiratory failure secondary to CHF and pneumonia. Her discharge weight was 214 pounds. She was discharged to a rehabilitation facility where she stayed for 2 weeks prior to going home. She was discharged with levofloxacin and Lasix 40 mg when necessary. During her hospitalization, the patient was also diagnosed with acute left frontoparietal infarct noted on MRI of the brain. She is scheduled to have a loop recorder placement in the future. She states that she has had intermittent sharp chest pain that is substernal without any radiation since discharge from the hospital. There are no alleviating or aggravating factors.   Discharge Diagnoses:  Active Problems:   Hypertension   Hyperlipidemia   Acute on chronic diastolic CHF (congestive heart failure) (HCC)   Generalized weakness   Hematochezia   FUO (fever of unknown origin)   Bleeding hemorrhoids  Acute on chronic diastolic CHF (congestive heart failure) (HCC): On admission was 101.4 kg she was diuresed to 97.5 kg with IV Lasix. She was started on IV  Lasix. Restricted and electrolytes monitored and repleted. She will go home on her current dose of Lasix as needed. She has been instructed if there is a weight gain of more than 3 pounds and a 24-hour period or 5 pounds in a week she needs to make an appointment with her primary care doctor.  Fever of unknown source and Leukocytosis probably due to community acquired pneumonia: Started empirically on IV vancomycin and cefepime. Her Procalcitonin was 3.5 on admission. She defervesced she was changed to oral Levaquin we she'll continue her 5 additional days in outpatient. U. Culture showed VRE, pt is asx.  Atypical chest pain: Crescendo due to pneumonia versus heart failure, cardiac biomarkers remain negative 3. Cardiac biomarkers remain flat. She is chest pain-free .  Lower extremity edema with pain: It was duplex is negative for DVT.  Hematochezia: Baseline hemoglobin is around 12, she is on aspirin for recent stroke about one month prior to admission. GI was consulted and recommended conservative management and follow-up as an outpatient with her GI doctor [facility] as this may be due to hemorrhoids. Resume aspirin. hbg stable.  Essential  Hypertension Resume continue ACE inhibitor and HCTZ, cont Coreg.  Fibromyalgia/chronic pain: Continue current home regimen.  Recent stroke: PT OT continue aspirin.  Discharge Instructions  Discharge Instructions    Diet - low sodium heart healthy    Complete by:  As directed    Increase activity slowly    Complete by:  As directed      Allergies as of 09/02/2016      Reactions   Codeine Other (See Comments)   Unknown reaction   Lipitor [atorvastatin] Other (See Comments)   Caused cramping in her legs  Medication List    STOP taking these medications   Vitamin D (Ergocalciferol) 50000 units Caps capsule Commonly known as:  DRISDOL     TAKE these medications   acetaminophen 500 MG tablet Commonly known as:   TYLENOL Take 1,000 mg by mouth every 6 (six) hours as needed for fever.   amLODipine 10 MG tablet Commonly known as:  NORVASC Take 10 mg by mouth daily.   aspirin 81 MG chewable tablet Chew 81 mg by mouth daily.   BIOTIN PO Take 2,500 mg by mouth daily.   carvedilol 6.25 MG tablet Commonly known as:  COREG Take 6.25 mg by mouth 2 (two) times daily with a meal.   enalapril-hydrochlorothiazide 10-25 MG tablet Commonly known as:  VASERETIC Take 1 tablet by mouth daily.   escitalopram 20 MG tablet Commonly known as:  LEXAPRO Take 20 mg by mouth daily.   FEOSOL 325 (65 FE) MG tablet Generic drug:  ferrous sulfate Take 325 mg by mouth daily with breakfast.   furosemide 40 MG tablet Commonly known as:  LASIX Take 40 mg by mouth daily as needed for fluid.   gabapentin 400 MG capsule Commonly known as:  NEURONTIN Take 400 mg by mouth 3 (three) times daily as needed (nerve pain).   GLUCOSAMINE 1500 COMPLEX PO Take 1 tablet by mouth 2 (two) times daily.   hydrALAZINE 100 MG tablet Commonly known as:  APRESOLINE Take 100 mg by mouth 3 (three) times daily.   hydrochlorothiazide 25 MG tablet Commonly known as:  HYDRODIURIL Take 25 mg by mouth daily.   isosorbide mononitrate 60 MG 24 hr tablet Commonly known as:  IMDUR Take 60 mg by mouth daily.   levofloxacin 500 MG tablet Commonly known as:  LEVAQUIN Take 1 tablet (500 mg total) by mouth daily.   LORazepam 0.5 MG tablet Commonly known as:  ATIVAN Take 0.5 mg by mouth 2 (two) times daily.   multivitamin with minerals Tabs tablet Take 1 tablet by mouth daily.   nitroGLYCERIN 0.4 MG SL tablet Commonly known as:  NITROSTAT Place 0.4 mg under the tongue every 5 (five) minutes as needed for chest pain.   nystatin 100000 UNIT/ML suspension Commonly known as:  MYCOSTATIN Take 5 mLs by mouth 4 (four) times daily.   nystatin powder Generic drug:  nystatin Apply topically 2 (two) times daily.   omeprazole 40 MG  capsule Commonly known as:  PRILOSEC Take 40 mg by mouth daily.   oxybutynin 10 MG 24 hr tablet Commonly known as:  DITROPAN-XL Take 10 mg by mouth at bedtime.   oxyCODONE-acetaminophen 7.5-325 MG tablet Commonly known as:  PERCOCET Take one or two tablets by mouth every 4-6 hours as needed for pain   pentazocine-naloxone 50-0.5 MG tablet Commonly known as:  TALWIN NX Take 1 tablet by mouth every 6 (six) hours as needed for moderate pain.   promethazine 25 MG tablet Commonly known as:  PHENERGAN Take 25 mg by mouth every 6 (six) hours as needed for nausea or vomiting.   rOPINIRole 0.5 MG tablet Commonly known as:  REQUIP Take 0.5 mg by mouth at bedtime.   rosuvastatin 20 MG tablet Commonly known as:  CRESTOR Take 20 mg by mouth daily.   sucralfate 1 g tablet Commonly known as:  CARAFATE Take 1 g by mouth daily as needed (as directed).   traMADol 50 MG tablet Commonly known as:  ULTRAM Take 50 mg by mouth every 6 (six) hours as needed for severe pain.  traZODone 50 MG tablet Commonly known as:  DESYREL Take 50 mg by mouth at bedtime.   zolpidem 10 MG tablet Commonly known as:  AMBIEN Take 10 mg by mouth at bedtime. What changed:  Another medication with the same name was added. Make sure you understand how and when to take each.   zolpidem 5 MG tablet Commonly known as:  AMBIEN Take 1 tablet (5 mg total) by mouth at bedtime as needed for sleep. What changed:  You were already taking a medication with the same name, and this prescription was added. Make sure you understand how and when to take each.            Durable Medical Equipment        Start     Ordered   09/02/16 1123  For home use only DME oxygen  Once    Question Answer Comment  Mode or (Route) Nasal cannula   Liters per Minute 22   Frequency Continuous (stationary and portable oxygen unit needed)   Oxygen delivery system Gas      09/02/16 1122   09/02/16 1108  For home use only DME oxygen   Once    Question Answer Comment  Mode or (Route) Nasal cannula   Liters per Minute 2   Frequency Continuous (stationary and portable oxygen unit needed)   Oxygen delivery system Gas      09/02/16 1107   09/02/16 1107  For home use only DME 3 n 1  Once     09/02/16 1107   09/02/16 1045  For home use only DME Bedside commode  Once    Question:  Patient needs a bedside commode to treat with the following condition  Answer:  Weakness   09/02/16 1044     Follow-up Information    [facility] Follow up.   Why:  Home Health Physical Therapy, Occupational Therapy and RN Contact information: [address] [place] [address] [phone]          Allergies  Allergen Reactions  . Codeine Other (See Comments)    Unknown reaction  . Lipitor [Atorvastatin] Other (See Comments)    Caused cramping in her legs    Consultations:  GI   Procedures/Studies: Dg Chest 2 View  Result Date: 08/30/2016 CLINICAL DATA:  Fever, hypoxia this morning.  Recent pneumonia. EXAM: CHEST  2 VIEW COMPARISON:  Chest radiograph August 14, 2016 FINDINGS: Cardiac silhouette is moderately enlarged unchanged. Mediastinal silhouette is nonsuspicious. Similar pulmonary vascular congestion without pleural effusion or focal consolidation. No pneumothorax. Osteopenia. Mild degenerative change of thoracic and lumbar spine. IMPRESSION: Stable cardiomegaly and pulmonary vascular congestion. Electronically Signed   By: Awilda Metro M.D.   On: 08/30/2016 05:36     Subjective: Shortness of breath and her weakness is much improved compared  Discharge Exam: Vitals:   09/01/16 2238 09/02/16 0440  BP: (!) 150/45 (!) 141/56  Pulse: (!) 58 70  Resp: 15 14  Temp: 98.3 F (36.8 C) 98.3 F (36.8 C)   Vitals:   09/02/16 0440 09/02/16 0824 09/02/16 0825 09/02/16 0831  BP: (!) 141/56     Pulse: 70     Resp: 14     Temp: 98.3 F (36.8 C)     TempSrc: Oral     SpO2: 96% (!) 85% 90%  93%  Weight: 97.5 kg (214 lb 15.2 oz)     Height:        General: Pt is alert,  awake, not in acute distress Cardiovascular: RRR, S1/S2 +, no rubs, no gallops Respiratory: CTA bilaterally, no wheezing, no rhonchi Abdominal: Soft, NT, ND, bowel sounds + Extremities: no edema, no cyanosis    The results of significant diagnostics from this hospitalization (including imaging, microbiology, ancillary and laboratory) are listed below for reference.     Microbiology: Recent Results (from the past 240 hour(s))  Culture, blood (routine x 2)     Status: None (Preliminary result)   Collection Time: 08/30/16  5:30 AM  Result Value Ref Range Status   Specimen Description BLOOD RIGHT HAND  Final   Special Requests BOTTLES DRAWN AEROBIC AND ANAEROBIC  Final   Culture   Final    NO GROWTH 3 DAYS Performed at [facility]    Report Status PENDING  Incomplete  Culture, blood (routine x 2)     Status: None (Preliminary result)   Collection Time: 08/30/16  5:50 AM  Result Value Ref Range Status   Specimen Description BLOOD RIGHT FOREARM  Final   Special Requests BOTTLES DRAWN AEROBIC AND ANAEROBIC  Final   Culture   Final    NO GROWTH 3 DAYS Performed at [facility]    Report Status PENDING  Incomplete  Urine culture     Status: Abnormal   Collection Time: 08/30/16  6:45 AM  Result Value Ref Range Status   Specimen Description URINE, CATHETERIZED  Final   Special Requests NONE  Final   Culture (A)  Final    >=100,000 COLONIES/mL VANCOMYCIN RESISTANT ENTEROCOCCUS   Report Status 09/01/2016 FINAL  Final  MRSA PCR Screening     Status: None   Collection Time: 08/30/16  4:24 PM  Result Value Ref Range Status   MRSA by PCR NEGATIVE NEGATIVE Final    Comment:        The GeneXpert MRSA Assay (FDA approved for NASAL specimens only), is one component of a comprehensive MRSA colonization surveillance program. It is not intended to diagnose MRSA infection nor to guide  or monitor treatment for MRSA infections.      Labs: BNP (last 3 results)  Recent Labs  08/30/16 0530  BNP 191.8*   Basic Metabolic Panel:  Recent Labs Lab 08/30/16 0530 08/31/16 0443 09/01/16 0647 09/02/16 0353  NA 134* 136 139 136  K 4.0 3.9 4.1 4.5  CL 97* 99* 100* 97*  CO2 27 30 31  32  GLUCOSE 118* 130* 106* 102*  BUN 30* 31* 26* 30*  CREATININE 1.04* 0.88 0.75 0.93  CALCIUM 9.4 9.2 9.5 9.5   Liver Function Tests:  Recent Labs Lab 08/30/16 0530  AST 18  ALT 14  ALKPHOS 80  BILITOT 0.4  PROT 5.9*  ALBUMIN 3.1*   No results for input(s): LIPASE, AMYLASE in the last 168 hours. No results for input(s): AMMONIA in the last 168 hours. CBC:  Recent Labs Lab 08/30/16 0530 08/31/16 0443  WBC 23.8* 12.6*  NEUTROABS 19.7*  --   HGB 11.0* 10.2*  HCT 33.4* 31.2*  MCV 93.8 94.8  PLT 228 199   Cardiac Enzymes:  Recent Labs Lab 08/30/16 0530 08/30/16 1057 08/30/16 1620  TROPONINI 0.03* 0.03* 0.03*   BNP: Invalid input(s): POCBNP CBG: No results for input(s): GLUCAP in the last 168 hours. D-Dimer No results for input(s): DDIMER in the last 72 hours. Hgb A1c No results for input(s): HGBA1C in the last 72 hours. Lipid Profile No results for input(s): CHOL, HDL, LDLCALC, TRIG, CHOLHDL, LDLDIRECT in  the last 72 hours. Thyroid function studies No results for input(s): TSH, T4TOTAL, T3FREE, THYROIDAB in the last 72 hours.  Invalid input(s): FREET3 Anemia work up No results for input(s): VITAMINB12, FOLATE, FERRITIN, TIBC, IRON, RETICCTPCT in the last 72 hours. Urinalysis    Component Value Date/Time   COLORURINE YELLOW 08/30/2016 0645   APPEARANCEUR CLEAR 08/30/2016 0645   LABSPEC 1.011 08/30/2016 0645   PHURINE 5.0 08/30/2016 0645   GLUCOSEU NEGATIVE 08/30/2016 0645   HGBUR NEGATIVE 08/30/2016 0645   BILIRUBINUR NEGATIVE 08/30/2016 0645   KETONESUR NEGATIVE 08/30/2016 0645   PROTEINUR NEGATIVE 08/30/2016 0645   NITRITE NEGATIVE 08/30/2016  0645   LEUKOCYTESUR NEGATIVE 08/30/2016 0645   Sepsis Labs Invalid input(s): PROCALCITONIN,  WBC,  LACTICIDVEN Microbiology Recent Results (from the past 240 hour(s))  Culture, blood (routine x 2)     Status: None (Preliminary result)   Collection Time: 08/30/16  5:30 AM  Result Value Ref Range Status   Specimen Description BLOOD RIGHT HAND  Final   Special Requests BOTTLES DRAWN AEROBIC AND ANAEROBIC  Final   Culture   Final    NO GROWTH 3 DAYS Performed at [facility]    Report Status PENDING  Incomplete  Culture, blood (routine x 2)     Status: None (Preliminary result)   Collection Time: 08/30/16  5:50 AM  Result Value Ref Range Status   Specimen Description BLOOD RIGHT FOREARM  Final   Special Requests BOTTLES DRAWN AEROBIC AND ANAEROBIC  Final   Culture   Final    NO GROWTH 3 DAYS Performed at [facility]    Report Status PENDING  Incomplete  Urine culture     Status: Abnormal   Collection Time: 08/30/16  6:45 AM  Result Value Ref Range Status   Specimen Description URINE, CATHETERIZED  Final   Special Requests NONE  Final   Culture (A)  Final    >=100,000 COLONIES/mL VANCOMYCIN RESISTANT ENTEROCOCCUS   Report Status 09/01/2016 FINAL  Final  MRSA PCR Screening     Status: None   Collection Time: 08/30/16  4:24 PM  Result Value Ref Range Status   MRSA by PCR NEGATIVE NEGATIVE Final    Comment:        The GeneXpert MRSA Assay (FDA approved for NASAL specimens only), is one component of a comprehensive MRSA colonization surveillance program. It is not intended to diagnose MRSA infection nor to guide or monitor treatment for MRSA infections.      Time coordinating discharge: Over 30 minutes  SIGNED:   Marinda Elk, MD  [facility] 09/02/2016, 12:46 PM Pager   If 7PM-7AM, please contact night-coverage [email or web address] Password [email or web address]

## 2016-09-02 NOTE — Progress Notes (Signed)
SATURATION QUALIFICATIONS: (This note is used to comply with regulatory documentation for home oxygen)  Patient Saturations on Room Air at Rest = 85%  Patient Saturations on Room Air while Ambulating = n/a%  Patient Saturations on 2 Liters of oxygen while Ambulating = 95%  Please briefly explain why patient needs home oxygen: 

## 2016-09-04 LAB — CULTURE, BLOOD (ROUTINE X 2)
CULTURE: NO GROWTH
CULTURE: NO GROWTH

## 2017-02-02 ENCOUNTER — Encounter (HOSPITAL_COMMUNITY): Payer: Self-pay | Admitting: Emergency Medicine

## 2017-02-02 ENCOUNTER — Emergency Department (HOSPITAL_COMMUNITY): Payer: Medicare HMO

## 2017-02-02 ENCOUNTER — Inpatient Hospital Stay (HOSPITAL_COMMUNITY)
Admission: EM | Admit: 2017-02-02 | Discharge: 2017-02-06 | DRG: 070 | Disposition: A | Discharge status: Rehabilitation Facility | Payer: Medicare HMO | Attending: Family Medicine | Admitting: Family Medicine

## 2017-02-02 DIAGNOSIS — J189 Pneumonia, unspecified organism: Secondary | ICD-10-CM | POA: Diagnosis present

## 2017-02-02 DIAGNOSIS — R000 Tachycardia, unspecified: Secondary | ICD-10-CM | POA: Diagnosis present

## 2017-02-02 DIAGNOSIS — Z66 Do not resuscitate: Secondary | ICD-10-CM | POA: Diagnosis present

## 2017-02-02 DIAGNOSIS — E860 Dehydration: Secondary | ICD-10-CM | POA: Diagnosis present

## 2017-02-02 DIAGNOSIS — I10 Essential (primary) hypertension: Secondary | ICD-10-CM | POA: Diagnosis present

## 2017-02-02 DIAGNOSIS — I509 Heart failure, unspecified: Secondary | ICD-10-CM

## 2017-02-02 DIAGNOSIS — N179 Acute kidney failure, unspecified: Secondary | ICD-10-CM | POA: Diagnosis present

## 2017-02-02 DIAGNOSIS — I4891 Unspecified atrial fibrillation: Secondary | ICD-10-CM | POA: Diagnosis present

## 2017-02-02 DIAGNOSIS — G4733 Obstructive sleep apnea (adult) (pediatric): Secondary | ICD-10-CM | POA: Diagnosis present

## 2017-02-02 DIAGNOSIS — G9341 Metabolic encephalopathy: Secondary | ICD-10-CM | POA: Diagnosis present

## 2017-02-02 DIAGNOSIS — E1140 Type 2 diabetes mellitus with diabetic neuropathy, unspecified: Secondary | ICD-10-CM | POA: Diagnosis present

## 2017-02-02 DIAGNOSIS — E559 Vitamin D deficiency, unspecified: Secondary | ICD-10-CM | POA: Diagnosis present

## 2017-02-02 DIAGNOSIS — I5032 Chronic diastolic (congestive) heart failure: Secondary | ICD-10-CM | POA: Diagnosis present

## 2017-02-02 DIAGNOSIS — J9601 Acute respiratory failure with hypoxia: Secondary | ICD-10-CM | POA: Diagnosis present

## 2017-02-02 DIAGNOSIS — K210 Gastro-esophageal reflux disease with esophagitis: Secondary | ICD-10-CM

## 2017-02-02 DIAGNOSIS — M797 Fibromyalgia: Secondary | ICD-10-CM | POA: Diagnosis present

## 2017-02-02 DIAGNOSIS — I350 Nonrheumatic aortic (valve) stenosis: Secondary | ICD-10-CM | POA: Diagnosis present

## 2017-02-02 DIAGNOSIS — J440 Chronic obstructive pulmonary disease with acute lower respiratory infection: Secondary | ICD-10-CM | POA: Diagnosis present

## 2017-02-02 DIAGNOSIS — M1990 Unspecified osteoarthritis, unspecified site: Secondary | ICD-10-CM | POA: Diagnosis present

## 2017-02-02 DIAGNOSIS — I214 Non-ST elevation (NSTEMI) myocardial infarction: Secondary | ICD-10-CM | POA: Diagnosis present

## 2017-02-02 DIAGNOSIS — Z7982 Long term (current) use of aspirin: Secondary | ICD-10-CM

## 2017-02-02 DIAGNOSIS — Z90710 Acquired absence of both cervix and uterus: Secondary | ICD-10-CM

## 2017-02-02 DIAGNOSIS — Z86010 Personal history of colonic polyps: Secondary | ICD-10-CM | POA: Diagnosis not present

## 2017-02-02 DIAGNOSIS — I639 Cerebral infarction, unspecified: Secondary | ICD-10-CM | POA: Diagnosis not present

## 2017-02-02 DIAGNOSIS — R4182 Altered mental status, unspecified: Secondary | ICD-10-CM | POA: Diagnosis present

## 2017-02-02 DIAGNOSIS — R079 Chest pain, unspecified: Secondary | ICD-10-CM

## 2017-02-02 DIAGNOSIS — Z7901 Long term (current) use of anticoagulants: Secondary | ICD-10-CM | POA: Diagnosis not present

## 2017-02-02 DIAGNOSIS — F329 Major depressive disorder, single episode, unspecified: Secondary | ICD-10-CM | POA: Diagnosis present

## 2017-02-02 DIAGNOSIS — E162 Hypoglycemia, unspecified: Secondary | ICD-10-CM | POA: Diagnosis not present

## 2017-02-02 DIAGNOSIS — E11649 Type 2 diabetes mellitus with hypoglycemia without coma: Secondary | ICD-10-CM | POA: Diagnosis present

## 2017-02-02 DIAGNOSIS — I480 Paroxysmal atrial fibrillation: Secondary | ICD-10-CM | POA: Diagnosis not present

## 2017-02-02 DIAGNOSIS — K219 Gastro-esophageal reflux disease without esophagitis: Secondary | ICD-10-CM | POA: Diagnosis present

## 2017-02-02 DIAGNOSIS — Z809 Family history of malignant neoplasm, unspecified: Secondary | ICD-10-CM

## 2017-02-02 DIAGNOSIS — G2581 Restless legs syndrome: Secondary | ICD-10-CM | POA: Diagnosis present

## 2017-02-02 DIAGNOSIS — E785 Hyperlipidemia, unspecified: Secondary | ICD-10-CM | POA: Diagnosis present

## 2017-02-02 DIAGNOSIS — I481 Persistent atrial fibrillation: Secondary | ICD-10-CM | POA: Diagnosis not present

## 2017-02-02 DIAGNOSIS — G459 Transient cerebral ischemic attack, unspecified: Secondary | ICD-10-CM

## 2017-02-02 DIAGNOSIS — F419 Anxiety disorder, unspecified: Secondary | ICD-10-CM | POA: Diagnosis present

## 2017-02-02 DIAGNOSIS — Z833 Family history of diabetes mellitus: Secondary | ICD-10-CM

## 2017-02-02 DIAGNOSIS — F0390 Unspecified dementia without behavioral disturbance: Secondary | ICD-10-CM | POA: Diagnosis present

## 2017-02-02 DIAGNOSIS — Z8673 Personal history of transient ischemic attack (TIA), and cerebral infarction without residual deficits: Secondary | ICD-10-CM

## 2017-02-02 DIAGNOSIS — I2510 Atherosclerotic heart disease of native coronary artery without angina pectoris: Secondary | ICD-10-CM | POA: Diagnosis present

## 2017-02-02 DIAGNOSIS — Z96651 Presence of right artificial knee joint: Secondary | ICD-10-CM | POA: Diagnosis present

## 2017-02-02 DIAGNOSIS — E876 Hypokalemia: Secondary | ICD-10-CM | POA: Diagnosis not present

## 2017-02-02 DIAGNOSIS — I110 Hypertensive heart disease with heart failure: Secondary | ICD-10-CM | POA: Diagnosis present

## 2017-02-02 DIAGNOSIS — Z885 Allergy status to narcotic agent status: Secondary | ICD-10-CM

## 2017-02-02 DIAGNOSIS — Z888 Allergy status to other drugs, medicaments and biological substances status: Secondary | ICD-10-CM

## 2017-02-02 DIAGNOSIS — R402242 Coma scale, best verbal response, confused conversation, at arrival to emergency department: Secondary | ICD-10-CM | POA: Diagnosis present

## 2017-02-02 DIAGNOSIS — E872 Acidosis: Secondary | ICD-10-CM | POA: Diagnosis present

## 2017-02-02 DIAGNOSIS — Z8249 Family history of ischemic heart disease and other diseases of the circulatory system: Secondary | ICD-10-CM

## 2017-02-02 DIAGNOSIS — R402142 Coma scale, eyes open, spontaneous, at arrival to emergency department: Secondary | ICD-10-CM | POA: Diagnosis present

## 2017-02-02 DIAGNOSIS — R402362 Coma scale, best motor response, obeys commands, at arrival to emergency department: Secondary | ICD-10-CM | POA: Diagnosis present

## 2017-02-02 HISTORY — DX: Atherosclerotic heart disease of native coronary artery without angina pectoris: I25.10

## 2017-02-02 LAB — I-STAT VENOUS BLOOD GAS, ED
Acid-Base Excess: 5 mmol/L — ABNORMAL HIGH (ref 0.0–2.0)
BICARBONATE: 30.8 mmol/L — AB (ref 20.0–28.0)
O2 SAT: 78 %
PCO2 VEN: 53.1 mmHg (ref 44.0–60.0)
PH VEN: 7.372 (ref 7.250–7.430)
PO2 VEN: 44 mmHg (ref 32.0–45.0)
TCO2: 32 mmol/L (ref 0–100)

## 2017-02-02 LAB — COMPREHENSIVE METABOLIC PANEL
ALBUMIN: 2.4 g/dL — AB (ref 3.5–5.0)
ALT: 14 U/L (ref 14–54)
ANION GAP: 10 (ref 5–15)
AST: 23 U/L (ref 15–41)
Alkaline Phosphatase: 68 U/L (ref 38–126)
BUN: 14 mg/dL (ref 6–20)
CHLORIDE: 103 mmol/L (ref 101–111)
CO2: 24 mmol/L (ref 22–32)
Calcium: 9.4 mg/dL (ref 8.9–10.3)
Creatinine, Ser: 1.35 mg/dL — ABNORMAL HIGH (ref 0.44–1.00)
GFR calc Af Amer: 41 mL/min — ABNORMAL LOW (ref >60)
GFR calc non Af Amer: 35 mL/min — ABNORMAL LOW (ref >60)
GLUCOSE: 153 mg/dL — AB (ref 65–99)
POTASSIUM: 4 mmol/L (ref 3.5–5.1)
SODIUM: 137 mmol/L (ref 135–145)
Total Bilirubin: 0.7 mg/dL (ref 0.3–1.2)
Total Protein: 5.2 g/dL — ABNORMAL LOW (ref 6.5–8.1)

## 2017-02-02 LAB — I-STAT TROPONIN, ED: TROPONIN I, POC: 0.04 ng/mL (ref 0.00–0.08)

## 2017-02-02 LAB — I-STAT CG4 LACTIC ACID, ED: LACTIC ACID, VENOUS: 0.78 mmol/L (ref 0.5–1.9)

## 2017-02-02 LAB — CBC
HEMATOCRIT: 31.2 % — AB (ref 36.0–46.0)
HEMOGLOBIN: 9.6 g/dL — AB (ref 12.0–15.0)
MCH: 28.9 pg (ref 26.0–34.0)
MCHC: 30.8 g/dL (ref 30.0–36.0)
MCV: 94 fL (ref 78.0–100.0)
Platelets: 293 10*3/uL (ref 150–400)
RBC: 3.32 MIL/uL — ABNORMAL LOW (ref 3.87–5.11)
RDW: 14.2 % (ref 11.5–15.5)
WBC: 11.4 10*3/uL — ABNORMAL HIGH (ref 4.0–10.5)

## 2017-02-02 LAB — URINALYSIS, ROUTINE W REFLEX MICROSCOPIC
Bilirubin Urine: NEGATIVE
GLUCOSE, UA: NEGATIVE mg/dL
KETONES UR: NEGATIVE mg/dL
NITRITE: NEGATIVE
PH: 5 (ref 5.0–8.0)
Protein, ur: NEGATIVE mg/dL
SPECIFIC GRAVITY, URINE: 1.008 (ref 1.005–1.030)

## 2017-02-02 LAB — AMMONIA: Ammonia: 27 umol/L (ref 9–35)

## 2017-02-02 LAB — PROTIME-INR
INR: 1.52
Prothrombin Time: 18.4 seconds — ABNORMAL HIGH (ref 11.4–15.2)

## 2017-02-02 LAB — CBG MONITORING, ED: GLUCOSE-CAPILLARY: 155 mg/dL — AB (ref 65–99)

## 2017-02-02 MED ORDER — THIAMINE HCL 100 MG/ML IJ SOLN
100.0000 mg | Freq: Every day | INTRAMUSCULAR | Status: DC
  Administered 2017-02-03: 100 mg via INTRAVENOUS
  Filled 2017-02-02 (×2): qty 2

## 2017-02-02 MED ORDER — SODIUM CHLORIDE 0.9 % IV SOLN: INTRAVENOUS | Status: DC

## 2017-02-02 MED ORDER — DEXTROSE-NACL 5-0.45 % IV SOLN
INTRAVENOUS | Status: DC
  Administered 2017-02-03: 02:00:00 via INTRAVENOUS

## 2017-02-02 NOTE — ED Triage Notes (Signed)
Per EMS, [facility] Care had been abandoned for hours d/t miscommunication at shift change. Pt was found with a CBG of 32. EMS gave 1 bag of D10 and went up to 236. Pt was found with a slight facial droop on rt side that resolved after D10. Hx of TIA with no deficits. Pt had urinated on herself. Pt has hx of HTN and her HTN medication was the only medication known to be given today - pt supposed to be taking Lasix and family has noticed increasing edema in her stomach the last 24 hours. Family does not know which medications were given. She wears 2L O2 at home. Today 3L O2 - 97%. Hx of CHF. Increased lethargy and A&O to self, place and year. Baseline dementia. Pt's family reports that she has not been eating and has been more lethargic since [facility] which she was there for abd pain/constipation and was diagnosed with hospital acquired pneumonia.

## 2017-02-02 NOTE — ED Notes (Signed)
Patient transported to CT 

## 2017-02-02 NOTE — ED Notes (Signed)
Delay in lab draw,  Pt currently in CT 

## 2017-02-02 NOTE — H&P (Signed)
Heather Walters LKG:[id number] DOB: 1931-10-05 DOA: 02/02/2017     PCP: Heather Mackintosh, MD  [facility] Outpatient Specialists: Cardiology Heather Walters, Neurology , Nephrology ([facility]) Patient coming from:  From facility [facility]  Chief Complaint: found unresponcive  HPI: Heather Walters is a 83 y.o. female with medical history significant of stroke/TIA on Eliquis since December, sleep apnea, hypertension, heart disease congestive heart failure, fibromyalgia, hypertension diabetes mellitus, COPD, dementia, anxiety and depression      Presented with found poorly responsive while in the nursing home facility Patient recently has been admitted at [facility] for 2 weeks with complicated stay initially presented with severe constipation associated abdominal pain CT scan was worrisome for mass. She developed A. fib with RVR which was a new diagnosis CHA2DS2.VAsc of 8 she was started on amiodarone 400 twice a day for 1 week will plan to change 200 once a day and Eliquis.   Colonoscopy showed 12 small colon polyps which were removed. Multiple polyps removal recommend holding off for at least 2 weeks on Eliquis  Patient was also diagnosed with HCAP with Zosyn and vancomycin she was noted to be hypoxic requiring 2 L of oxygen and discharge and doxycycline for 7 more days  She was found to have non-ST elevation MI and undergone left heart catheterization 28th of MAy  showed mild to moderate coronary artery disease  While hospitalized patient was noted to be confused which was to be secondary to delirium due to infection CT scan of the brain was not obtained initilly secondary to instability. But was done on 31st and ws non acute  CT scan of abdomen showed thickening of transverse colon which is initially was worrisome for malignancy she was finally able  undergone EGD and colonoscopy on 02/01/2017 and EGD showed esophagitis a GE junction mild nonobstructing Schatzki's ring, small hiatal hernia, mild gastritis,  pigmented spot in the antrum of the stomach and multiple pigmented spots in the entire duodenum (suspect medication effect) eliquis was to be held for 2 weeks She was felt to be safe to resume anticoagulation on 02/15/2017  The plan was patient to be discharged to SNF on 6/7 to [facility].  For unclear reason she was supposed to be transferred to [facility] facility. Apparently the stuff was not aware that she actually did not get transferred due to being a weekend. The staff was not aware of her presence the nursing facility for what seems to be 24 hours. The family was at bedside and reports that she has not been able to stay awake enough to eat or drink all day. Her mental status have been fluctuating for the past 2 weeks and the family was told was delirium. But over the past 24 hours she completely deteriorated to the point where she was very difficult to arouse.  When she was found by nursing stuff next day she was very lethargic. It is possible the patient was not receiving any by mouth intake during that time. Her BG was checked and was  32 after receiving Glucose it was  Up to 150 but she remained somnolent. Family states that due to confusion on discharge paperwork she did receive Eliquis yesterday. They are unsure of other medications.  Patient was transferred to [facility] ER. Arrival she remained poorly responsive CT scan of the head was done and showed left cerebellar CVA neurology was consulted  Patient has history of anxiety depression takes Paxil   IN ER:  Temp (24hrs), Avg:97.6 F (36.4  C), Min:97.6 F (36.4 C), Max:97.6 F (36.4 C)      on arrival  ED Triage Vitals  Enc Vitals Group     BP 02/02/17 2030 (!) 166/50     Pulse Rate 02/02/17 2030 73     Resp 02/02/17 2030 20     Temp 02/02/17 2049 97.6 F (36.4 C)     Temp Source 02/02/17 2049 Rectal     SpO2 02/02/17 2027 97 %     Weight 02/02/17 2031 190 lb (86.2 kg)     Height 02/02/17 2031 5\' 1"  (1.549 m)     Head  Circumference --      Peak Flow --      Pain Score --      Pain Loc --      Pain Edu? --      Excl. in GC? --   RR 24 96% 3L HR 70 BP 147/49 VBG 7.372/53.1 Na 137 CR 1.35 WBC 11.4 hemoglobin 9.6 which is close to baseline INR 1.52  Ammonia 27 troponin 0.04 LA 0.78  Following Medications were ordered in ER: Medications - No data to display   ER provider discussed case with:Neurology   Hospitalist was called for admission for new CVA  Review of Systems:    Pertinent positives include: confusion  Constitutional:  No weight loss, night sweats, Fevers, chills, fatigue, weight loss  HEENT:  No headaches, Difficulty swallowing,Tooth/dental problems,Sore throat,  No sneezing, itching, ear ache, nasal congestion, post nasal drip,  Cardio-vascular:  No chest pain, Orthopnea, PND, anasarca, dizziness, palpitations.no Bilateral lower extremity swelling  GI:  No heartburn, indigestion, abdominal pain, nausea, vomiting, diarrhea, change in bowel habits, loss of appetite, melena, blood in stool, hematemesis Resp:  no shortness of breath at rest. No dyspnea on exertion, No excess mucus, no productive cough, No non-productive cough, No coughing up of blood.No change in color of mucus.No wheezing. Skin:  no rash or lesions. No jaundice GU:  no dysuria, change in color of urine, no urgency or frequency. No straining to urinate.  No flank pain.  Musculoskeletal:  No joint pain or no joint swelling. No decreased range of motion. No back pain.  Psych:  No change in mood or affect. No depression or anxiety. No memory loss.  Neuro: no localizing neurological complaints, no tingling, no weakness, no double vision, no gait abnormality, no slurred speech, no   As per HPI otherwise 10 point review of systems negative.   Past Medical History: Past Medical History:  Diagnosis Date  . Anxiety   . Arthritis   . Depression   . Fibromyalgia   . GERD (gastroesophageal reflux disease)   .  Hyperlipidemia   . Hypertension   . Intervertebral disk disease    lumbar  . Mild aortic stenosis   . Neuropathy   . Stroke (HCC)   . Vitamin D deficiency    Past Surgical History:  Procedure Laterality Date  . ABDOMINAL HYSTERECTOMY    . CHOLECYSTECTOMY    . COLONOSCOPY    . ESOPHAGOGASTRODUODENOSCOPY ENDOSCOPY    . JOINT REPLACEMENT     right total knee     Social History:  Ambulatory   Dan Humphreys     reports that she has never smoked. She has never used smokeless tobacco. She reports that she does not drink alcohol or use drugs.  Allergies:   Allergies  Allergen Reactions  . Codeine Other (See Comments)    Unknown reaction  . Lipitor [Atorvastatin] Other (  See Comments)    Caused cramping in her legs       Family History:   Family History  Problem Relation Age of Onset  . Diabetes Mother   . CAD Mother   . Cancer Mother   . Diabetes Father     Medications: Prior to Admission medications   Medication Sig Start Date End Date Taking? Authorizing Provider  acetaminophen (TYLENOL) 500 MG tablet Take 1,000 mg by mouth every 6 (six) hours as needed for fever.   Yes [provider]  albuterol (PROVENTIL HFA;VENTOLIN HFA) 108 (90 Base) MCG/ACT inhaler Inhale 2 puffs into the lungs every 6 (six) hours as needed for wheezing or shortness of breath.   Yes [provider]  albuterol (PROVENTIL) (2.5 MG/3ML) 0.083% nebulizer solution Take 2.5 mg by nebulization every 6 (six) hours as needed for wheezing or shortness of breath.   Yes [provider]  ALPRAZolam (XANAX) 0.25 MG tablet Take 0.25 mg by mouth 3 (three) times daily as needed for anxiety.   Yes [provider]  amiodarone (PACERONE) 200 MG tablet Take 400 mg by mouth 2 (two) times daily.   Yes [provider]  amLODipine (NORVASC) 10 MG tablet Take 10 mg by mouth daily.   Yes [provider]  apixaban (ELIQUIS) 5 MG TABS tablet Take 5 mg by mouth 2 (two) times  daily.   Yes [provider]  aspirin EC 81 MG tablet Take 81 mg by mouth daily.   Yes [provider]  BIOTIN PO Take 2,500 mg by mouth daily.   Yes [provider]  carvedilol (COREG) 6.25 MG tablet Take 6.25 mg by mouth 2 (two) times daily with a meal.   Yes [provider]  ferrous sulfate (FEOSOL) 325 (65 FE) MG tablet Take 325 mg by mouth daily with breakfast.   Yes [provider]  furosemide (LASIX) 40 MG tablet Take 20 mg by mouth 2 (two) times daily.    Yes [provider]  Glucosamine-Chondroit-Vit C-Mn (GLUCOSAMINE 1500 COMPLEX PO) Take 1 tablet by mouth 2 (two) times daily.   Yes [provider]  hydrALAZINE (APRESOLINE) 100 MG tablet Take 100 mg by mouth 3 (three) times daily.   Yes [provider]  isosorbide mononitrate (IMDUR) 60 MG 24 hr tablet Take 60 mg by mouth daily.   Yes [provider]  Multiple Vitamin (MULTIVITAMIN WITH MINERALS) TABS tablet Take 1 tablet by mouth daily.   Yes [provider]  nitroGLYCERIN (NITROSTAT) 0.4 MG SL tablet Place 0.4 mg under the tongue every 5 (five) minutes as needed for chest pain.   Yes [provider]  omeprazole (PRILOSEC) 40 MG capsule Take 40 mg by mouth daily.   Yes [provider]  ondansetron (ZOFRAN) 4 MG tablet Take 4 mg by mouth every 8 (eight) hours as needed for nausea or vomiting.   Yes [provider]  promethazine (PHENERGAN) 25 MG tablet Take 25 mg by mouth every 6 (six) hours as needed for nausea or vomiting.   Yes [provider]  rOPINIRole (REQUIP) 0.5 MG tablet Take 0.5 mg by mouth at bedtime.   Yes [provider]  rosuvastatin (CRESTOR) 20 MG tablet Take 20 mg by mouth daily.   Yes [provider]  sucralfate (CARAFATE) 1 G tablet Take 1 g by mouth daily as needed (ulcer pain).    Yes [provider]  traMADol (ULTRAM) 50 MG tablet Take 50 mg by  mouth every 8 (eight)  hours as needed for severe pain.    Yes [provider]  traZODone (DESYREL) 50 MG tablet Take 75 mg by mouth at bedtime.    Yes [provider]    Physical Exam: Patient Vitals for the past 24 hrs:  BP Temp Temp src Pulse Resp SpO2 Height Weight  02/02/17 2100 (!) 147/49 - - 70 (!) 24 96 % - -  02/02/17 2049 - 97.6 F (36.4 C) Rectal - - - - -  02/02/17 2045 (!) 159/50 - - 75 16 98 % - -  02/02/17 2031 - - - - - - 5\' 1"  (1.549 m) 86.2 kg (190 lb)  02/02/17 2030 (!) 166/50 - - 73 20 95 % - -  02/02/17 2027 - - - - - 97 % - -    1. General:  in No Acute distress 2. Psychological: somnolent not Oriented 3. Head/ENT:    Dry Mucous Membranes                          Head Non traumatic, neck supple                         Poor Dentition 4. SKIN:decreased Skin turgor,  Skin clean Dry and intact no rash 5. Heart: Regular rate and rhythm no  Murmur, Rub or gallop 6. Lungs: no wheezes or crackles   7. Abdomen: Soft,  non-tender, Non distended 8. Lower extremities: no clubbing, cyanosis,trace edema 9. Neurologically not fully Following commands appears to be weak all over withdraws to painful stimuli.   10. MSK: Normal range of motion   body mass index is 35.9 kg/m.  Labs on Admission:   Labs on Admission: I have personally reviewed following labs and imaging studies  CBC:  Recent Labs Lab 02/02/17 2135  WBC 11.4*  HGB 9.6*  HCT 31.2*  MCV 94.0  PLT 293   Basic Metabolic Panel:  Recent Labs Lab 02/02/17 2135  NA 137  K 4.0  CL 103  CO2 24  GLUCOSE 153*  BUN 14  CREATININE 1.35*  CALCIUM 9.4   GFR: Estimated Creatinine Clearance: 32.1 mL/min (A) (by C-G formula based on SCr of 1.35 mg/dL (H)). Liver Function Tests:  Recent Labs Lab 02/02/17 2135  AST 23  ALT 14  ALKPHOS 68  BILITOT 0.7  PROT 5.2*  ALBUMIN 2.4*   No results for input(s): LIPASE, AMYLASE in the last 168 hours.  Recent Labs Lab 02/02/17 2135  AMMONIA 27    Coagulation Profile:  Recent Labs Lab 02/02/17 2135  INR 1.52   Cardiac Enzymes: No results for input(s): CKTOTAL, CKMB, CKMBINDEX, TROPONINI in the last 168 hours. BNP (last 3 results) No results for input(s): PROBNP in the last 8760 hours. HbA1C: No results for input(s): HGBA1C in the last 72 hours. CBG:  Recent Labs Lab 02/02/17 2028  GLUCAP 155*   Lipid Profile: No results for input(s): CHOL, HDL, LDLCALC, TRIG, CHOLHDL, LDLDIRECT in the last 72 hours. Thyroid Function Tests: No results for input(s): TSH, T4TOTAL, FREET4, T3FREE, THYROIDAB in the last 72 hours. Anemia Panel: No results for input(s): VITAMINB12, FOLATE, FERRITIN, TIBC, IRON, RETICCTPCT in the last 72 hours. Urine analysis:    Component Value Date/Time   COLORURINE YELLOW 08/30/2016 0645   APPEARANCEUR CLEAR 08/30/2016 0645   LABSPEC 1.011 08/30/2016 0645   PHURINE 5.0 08/30/2016 0645   GLUCOSEU NEGATIVE 08/30/2016  0645   HGBUR NEGATIVE 08/30/2016 0645   BILIRUBINUR NEGATIVE 08/30/2016 0645   KETONESUR NEGATIVE 08/30/2016 0645   PROTEINUR NEGATIVE 08/30/2016 0645   NITRITE NEGATIVE 08/30/2016 0645   LEUKOCYTESUR NEGATIVE 08/30/2016 0645   Sepsis Labs: @LABRCNTIP (procalcitonin:4,lacticidven:4) )No results found for this or any previous visit (from the past 240 hour(s)).   UA  no evidence of UTI   d  No results found for: HGBA1C  Estimated Creatinine Clearance: 32.1 mL/min (A) (by C-G formula based on SCr of 1.35 mg/dL (H)).  BNP (last 3 results) No results for input(s): PROBNP in the last 8760 hours.   ECG REPORT  Independently reviewed Rate: 75  Rhythm: sinus rythm ST&T Change: No acute ischemic changes   QTC 454  Filed Weights   02/02/17 2031  Weight: 86.2 kg (190 lb)     Cultures:    Component Value Date/Time   SDES URINE, CATHETERIZED 08/30/2016 0645   SPECREQUEST NONE 08/30/2016 0645   CULT (A) 08/30/2016 0645    >=100,000 COLONIES/mL VANCOMYCIN RESISTANT ENTEROCOCCUS    REPTSTATUS 09/01/2016 FINAL 08/30/2016 0645     Radiological Exams on Admission: Dg Chest 2 View  Result Date: 02/02/2017 CLINICAL DATA:  Altered mental status EXAM: CHEST  2 VIEW COMPARISON:  None. FINDINGS: Shallow lung inflation. Bilateral opacities suggesting pulmonary edema. No pneumothorax. Small left pleural effusion. Mild cardiomegaly. IMPRESSION: Shallow lung inflation and mild pulmonary edema. Electronically Signed   By: Deatra Robinson M.D.   On: 02/02/2017 21:30   Ct Head Wo Contrast  Result Date: 02/02/2017 CLINICAL DATA:  83 year old female with altered mental status. EXAM: CT HEAD WITHOUT CONTRAST TECHNIQUE: Contiguous axial images were obtained from the base of the skull through the vertex without intravenous contrast. COMPARISON:  None. FINDINGS: Brain: Hypodensity within the left cerebellum likely represents an acute to subacute infarct. There is no evidence of hemorrhage, midline shift or hydrocephalus. Mild chronic small-vessel white matter ischemic changes are noted. Vascular: Intracranial atherosclerotic calcifications noted. Skull: Normal. Negative for fracture or focal lesion. Sinuses/Orbits: No acute finding. Other: None. IMPRESSION: Left cerebellar hypodensity compatible with infarct-likely acute to subacute. No evidence of hemorrhage. Mild chronic small-vessel white matter ischemic changes. Electronically Signed   By: Harmon Pier M.D.   On: 02/02/2017 21:27    Chart has been reviewed    Assessment/Plan  82 y.o. female with medical history significant of stroke, sleep apnea, hypertension, heart disease congestive heart failure, fibromyalgia, hypertension diabetes mellitus, COPD, dementia, anxiety and depression  Admitted for altered mental status in the setting of new CVA  Present on Admission: . CVA (cerebral vascular accident) (HCC) -  - will admit based on TIA/CVA protocol, await results of MRA/MRI, Carotid Doppler and Echo, obtain cardiac enzymes,  ECG,   Lipid  panel, TSH. Order PT/OT evaluation. Will make sure patient is on antiplatelet agent aspirin.   Neurology consulted.  Stroke in a setting of new A. fib with RVR,eliquis  discontinuation, recent cardiac catheterization  and possibly prolonged hypoglycemia  . Acute metabolic encephalopathy - in this setting of CVA. At this point no other evidence of infectious etiology. We'll obtain MRI of her brain to evaluate for extent of CVA. Obtain tox screen, rehydrate paroxysmal  Atrial fibrillation (HCC)        - CHA2DS2 vas score 8  : continue to hold anticoagulation as per neurology until MRI has resulted        -  Rate control:  Coreg and hold unable to tolerate by mouth and  allow some permissive Hypertension for today        - Rhythm control: hold amiodarone unable to tolerate by mouth we will resume once able  . Chronic diastolic CHF (congestive heart failure) (HCC) - currently appears to be an AK I decreased by mouth intake will hold Lasix . GERD (gastroesophageal reflux disease) - stable can order Protonix IV until able to tolerate by mouth . Hypertension -home medications  on hold to allow permissive hypertension . Dehydration - administer IV fluids  . AKI (acute kidney injury) (HCC) tachycardia secondary to dehydration and decreased by mouth intake will hold Lasix rehydrate check urine electrolytes . Hypoglycemia -hypoglycemia protocol, order frequent CBGs if remains stable change to every 4 hours. Administer D5 half-normal patient unable to tolerate by mouth secondary to decreased mentation. Administer thiamin  History of sleep apnea hold C Pap patient is currently not alert enough to use  History of restless leg hold Requip secondary to sedation History of anxiety hold home medications for now secondary to sedation Other plan as per orders.  DVT prophylaxis:  SCD    Code Status:   DNR/DNI as per family   Family Communication:   Family   at  Bedside discussed with 2 daughters and husband     Disposition Plan:    likely will need placement for rehabilitation                                               Would benefit from PT/OT eval prior to DC  ordered                       Social Work consulted if no significant improvement and lack of benefit from palliative care consult                          Consults called: Neurology    Admission status:  inpatient       Level of care   tele    I have spent a total of 76 min on this admission  extra time was spent to discuss case with consultants as well as to discuss with family CODE STATUS extensively  Heather Walters 02/02/2017, 12:18 AM   [facility]  Pager [phone]   after 2 AM please page floor coverage PA If 7AM-7PM, please contact the day team taking care of the patient  [email or web address]  Password [email or web address]

## 2017-02-02 NOTE — ED Provider Notes (Signed)
[facility]-EMERGENCY DEPT Provider Note   CSN: [id number] Arrival date & time: 02/02/17  2015     History   Chief Complaint Chief Complaint  Patient presents with  . Altered Mental Status    HPI Heather Walters is a 83 y.o. female.  The history is provided by the EMS personnel. The history is limited by the condition of the patient.    Patient is an 83 year old with past medical history significant for dementia, COPD, OSA, CAD, prior CVA, recent hospitalization for atrial fibrillation and constipation. Patient recently received a colonoscopy with a significant amount of polyps removed. Patient's Eliquis was held during that time, GI recommended restarting Eliquis on 6/21. Patient was discharged to rehabilitation facility.  Per EMS report, patient was supposed to be transferred to another facility today. However transfer did not occur. This was not verbalized to the nighttime nursing staff said they were unaware that the patient was still at the facility. Patient was found approximately 8-10 hours later. Patient was found to be altered and somnolent, glucose 32. Patient was given 1 bag of D 10 and glucose improved. Patient with generalized fatigue since last night. Patient was complaining of mild chest pain last night that has since resolved. Patient was found to have urinated on herself. No known trauma.  Past Medical History:  Diagnosis Date  . Anxiety   . Arthritis   . Depression   . Fibromyalgia   . GERD (gastroesophageal reflux disease)   . Hyperlipidemia   . Hypertension   . Intervertebral disk disease    lumbar  . Mild aortic stenosis   . Neuropathy   . Stroke (HCC)   . Vitamin D deficiency     Patient Active Problem List   Diagnosis Date Noted  . Hypoglycemia 02/02/2017  . Acute metabolic encephalopathy 02/02/2017  . A-fib (HCC) 02/02/2017  . Chronic diastolic CHF (congestive heart failure) (HCC) 02/02/2017  . Dehydration 02/02/2017  . CVA (cerebral vascular accident)  (HCC) 02/02/2017  . AKI (acute kidney injury) (HCC) 02/02/2017  . Acute on chronic diastolic CHF (congestive heart failure) (HCC) 08/30/2016  . Hematochezia 08/30/2016  . FUO (fever of unknown origin) 08/30/2016  . Generalized weakness   . Bleeding hemorrhoids   . S/P total knee arthroplasty 07/29/2014  . Post-operative infection 07/29/2014  . Hypertension   . Depression   . Neuropathy   . GERD (gastroesophageal reflux disease)   . Hyperlipidemia   . Vitamin D deficiency     Past Surgical History:  Procedure Laterality Date  . ABDOMINAL HYSTERECTOMY    . CHOLECYSTECTOMY    . COLONOSCOPY    . ESOPHAGOGASTRODUODENOSCOPY ENDOSCOPY    . JOINT REPLACEMENT     right total knee    OB History    No data available       Home Medications    Prior to Admission medications   Medication Sig Start Date End Date Taking? Authorizing Provider  acetaminophen (TYLENOL) 500 MG tablet Take 1,000 mg by mouth every 6 (six) hours as needed for fever.   Yes [provider]  albuterol (PROVENTIL HFA;VENTOLIN HFA) 108 (90 Base) MCG/ACT inhaler Inhale 2 puffs into the lungs every 6 (six) hours as needed for wheezing or shortness of breath.   Yes [provider]  albuterol (PROVENTIL) (2.5 MG/3ML) 0.083% nebulizer solution Take 2.5 mg by nebulization every 6 (six) hours as needed for wheezing or shortness of breath.   Yes [provider]  ALPRAZolam (XANAX) 0.25 MG tablet Take 0.25  mg by mouth 3 (three) times daily as needed for anxiety.   Yes [provider]  amiodarone (PACERONE) 200 MG tablet Take 400 mg by mouth 2 (two) times daily.   Yes [provider]  amLODipine (NORVASC) 10 MG tablet Take 10 mg by mouth daily.   Yes [provider]  apixaban (ELIQUIS) 5 MG TABS tablet Take 5 mg by mouth 2 (two) times daily.   Yes [provider]  aspirin EC 81 MG tablet Take 81 mg by mouth daily.   Yes [provider]  BIOTIN PO Take  2,500 mg by mouth daily.   Yes [provider]  carvedilol (COREG) 6.25 MG tablet Take 6.25 mg by mouth 2 (two) times daily with a meal.   Yes [provider]  ferrous sulfate (FEOSOL) 325 (65 FE) MG tablet Take 325 mg by mouth daily with breakfast.   Yes [provider]  furosemide (LASIX) 40 MG tablet Take 20 mg by mouth 2 (two) times daily.    Yes [provider]  Glucosamine-Chondroit-Vit C-Mn (GLUCOSAMINE 1500 COMPLEX PO) Take 1 tablet by mouth 2 (two) times daily.   Yes [provider]  hydrALAZINE (APRESOLINE) 100 MG tablet Take 100 mg by mouth 3 (three) times daily.   Yes [provider]  isosorbide mononitrate (IMDUR) 60 MG 24 hr tablet Take 60 mg by mouth daily.   Yes [provider]  Multiple Vitamin (MULTIVITAMIN WITH MINERALS) TABS tablet Take 1 tablet by mouth daily.   Yes [provider]  nitroGLYCERIN (NITROSTAT) 0.4 MG SL tablet Place 0.4 mg under the tongue every 5 (five) minutes as needed for chest pain.   Yes [provider]  omeprazole (PRILOSEC) 40 MG capsule Take 40 mg by mouth daily.   Yes [provider]  ondansetron (ZOFRAN) 4 MG tablet Take 4 mg by mouth every 8 (eight) hours as needed for nausea or vomiting.   Yes [provider]  promethazine (PHENERGAN) 25 MG tablet Take 25 mg by mouth every 6 (six) hours as needed for nausea or vomiting.   Yes [provider]  rOPINIRole (REQUIP) 0.5 MG tablet Take 0.5 mg by mouth at bedtime.   Yes [provider]  rosuvastatin (CRESTOR) 20 MG tablet Take 20 mg by mouth daily.   Yes [provider]  sucralfate (CARAFATE) 1 G tablet Take 1 g by mouth daily as needed (ulcer pain).    Yes [provider]  traMADol (ULTRAM) 50 MG tablet Take 50 mg by mouth every 8 (eight) hours as needed for severe pain.    Yes [provider]  traZODone (DESYREL) 50 MG tablet Take 75 mg by mouth at bedtime.    Yes  [provider]    Family History Family History  Problem Relation Age of Onset  . Diabetes Mother   . CAD Mother   . Cancer Mother   . Diabetes Father     Social History Social History  Substance Use Topics  . Smoking status: Never Smoker  . Smokeless tobacco: Never Used  . Alcohol use No     Allergies   Codeine and Lipitor [atorvastatin]   Review of Systems Review of Systems  Constitutional: Positive for appetite change, chills and fatigue. Negative for fever.  HENT: Negative for trouble swallowing.   Respiratory: Positive for cough and chest tightness. Negative for shortness of breath.   Cardiovascular: Positive for leg swelling. Negative for chest pain.  Gastrointestinal: Negative for  abdominal pain, diarrhea, nausea and vomiting.  Genitourinary: Negative for dysuria.  Musculoskeletal: Negative for back pain.  Skin: Negative for rash.  Neurological: Positive for weakness ( Generalized). Negative for light-headedness.  Psychiatric/Behavioral: Positive for confusion.     Physical Exam Updated Vital Signs BP (!) 178/61   Pulse 82   Temp 97.6 F (36.4 C) (Rectal)   Resp 19   Ht 5\' 1"  (1.549 m)   Wt 86.2 kg (190 lb)   SpO2 98%   BMI 35.90 kg/m   Physical Exam  Constitutional: She appears well-developed and well-nourished.  Somnolent on exam, requires continuous stimulus in order to obtain history and exam.  HENT:  Head: Atraumatic.  Eyes: EOM are normal. Pupils are equal, round, and reactive to light.  Neck: Normal range of motion. Neck supple.  Cardiovascular: Normal rate, regular rhythm, normal heart sounds and intact distal pulses.   No murmur heard. Pulmonary/Chest: Effort normal. No respiratory distress. She has no wheezes. She has rales ( Crackles bilateral lung fields).  Abdominal: She exhibits no distension. There is no tenderness. There is no guarding.  Musculoskeletal: Normal range of motion. She exhibits edema ( +1 bilateral pitting  edema).  Neurological: She is alert. She has normal strength. No cranial nerve deficit or sensory deficit. GCS eye subscore is 4. GCS verbal subscore is 4. GCS motor subscore is 6.  Oriented to person and time. Answers most questions appropriately, uncertain on the events of yesterday and today. Gait deferred.   Skin: Skin is warm. Capillary refill takes less than 2 seconds. No pallor.  Psychiatric: She has a normal mood and affect.  Vitals reviewed.    ED Treatments / Results  Labs (all labs ordered are listed, but only abnormal results are displayed) Labs Reviewed  COMPREHENSIVE METABOLIC PANEL - Abnormal; Notable for the following:       Result Value   Glucose, Bld 153 (*)    Creatinine, Ser 1.35 (*)    Total Protein 5.2 (*)    Albumin 2.4 (*)    GFR calc non Af Amer 35 (*)    GFR calc Af Amer 41 (*)    All other components within normal limits  CBC - Abnormal; Notable for the following:    WBC 11.4 (*)    RBC 3.32 (*)    Hemoglobin 9.6 (*)    HCT 31.2 (*)    All other components within normal limits  PROTIME-INR - Abnormal; Notable for the following:    Prothrombin Time 18.4 (*)    All other components within normal limits  URINALYSIS, ROUTINE W REFLEX MICROSCOPIC - Abnormal; Notable for the following:    APPearance HAZY (*)    Hgb urine dipstick SMALL (*)    Leukocytes, UA LARGE (*)    Bacteria, UA RARE (*)    Squamous Epithelial / LPF 6-30 (*)    All other components within normal limits  CBG MONITORING, ED - Abnormal; Notable for the following:    Glucose-Capillary 155 (*)    All other components within normal limits  I-STAT VENOUS BLOOD GAS, ED - Abnormal; Notable for the following:    Bicarbonate 30.8 (*)    Acid-Base Excess 5.0 (*)    All other components within normal limits  AMMONIA  CREATININE, URINE, RANDOM  SODIUM, URINE, RANDOM  RAPID URINE DRUG SCREEN, HOSP PERFORMED  I-STAT CG4 LACTIC ACID, ED  CBG MONITORING, ED  I-STAT TROPOININ, ED    EKG   EKG Interpretation None  Radiology Dg Chest 2 View  Result Date: 02/02/2017 CLINICAL DATA:  Altered mental status EXAM: CHEST  2 VIEW COMPARISON:  None. FINDINGS: Shallow lung inflation. Bilateral opacities suggesting pulmonary edema. No pneumothorax. Small left pleural effusion. Mild cardiomegaly. IMPRESSION: Shallow lung inflation and mild pulmonary edema. Electronically Signed   By: Deatra RobinsonKevin  Herman M.D.   On: 02/02/2017 21:30   Ct Head Wo Contrast  Result Date: 02/02/2017 CLINICAL DATA:  83 year old female with altered mental status. EXAM: CT HEAD WITHOUT CONTRAST TECHNIQUE: Contiguous axial images were obtained from the base of the skull through the vertex without intravenous contrast. COMPARISON:  None. FINDINGS: Brain: Hypodensity within the left cerebellum likely represents an acute to subacute infarct. There is no evidence of hemorrhage, midline shift or hydrocephalus. Mild chronic small-vessel white matter ischemic changes are noted. Vascular: Intracranial atherosclerotic calcifications noted. Skull: Normal. Negative for fracture or focal lesion. Sinuses/Orbits: No acute finding. Other: None. IMPRESSION: Left cerebellar hypodensity compatible with infarct-likely acute to subacute. No evidence of hemorrhage. Mild chronic small-vessel white matter ischemic changes. Electronically Signed   By: Harmon PierJeffrey  Hu M.D.   On: 02/02/2017 21:27    Procedures Procedures (including critical care time)  Medications Ordered in ED Medications  0.9 %  sodium chloride infusion (not administered)  thiamine (B-1) injection 100 mg (not administered)  dextrose 5 %-0.45 % sodium chloride infusion (not administered)     Initial Impression / Assessment and Plan / ED Course  I have reviewed the triage vital signs and the nursing notes.  Pertinent labs & imaging results that were available during my care of the patient were reviewed by me and considered in my medical decision making (see chart for  details).  Clinical Course as of Feb 04 19  Fri Feb 02, 2017  2229 DG Chest 2 View [SM]    Clinical Course User Index [SM] Corena HerterMumma, Amiel Mccaffrey, MD    Patient is an 83 year old female with multiple medical problems including COPD, A. fib with a recent hospitalization for pneumonia and constipation, who presents to the emergency department for altered mental status and generalized weakness. Recently Eliquis was held following extensive polyps removed.  Found with worsening somnolence for 1 day. GCS 14, more confused than baseline per report. Afebrile, hemodynamically stable. O2 stable on home 2 L nasal cannula.   On arrival patient is somnolent, answers most questions appropriately. Appears ill and pale. Lungs with crackles to lower lung field and large for many edema. Nonfocal neurologic exam. Benign abdominal exam.  Differential includes infectious etiology, CVA, electrolyte abnormality, hypercarbia, intracranial hemorrhage. Lab work remarkable for CO2 53. Ammonia within normal limits. No significant electrolyte abnormalities. Mild AKI, creatinine 1.35 (baseline 1). UA showed no signs of UTI. CXR w/o signs of PNA. CT head showed acute/subacte cerebellar infarction (new from CT on 05/31). Doubt ACS, troponin undetectable, EKG showed no signs of acute ischemia.  Neurology consultation for recommendations of restarting anticoagulation and recent polyp removal. Patient admitted to medicine for further management. Patient stable for the floor.  Final Clinical Impressions(s) / ED Diagnoses   Final diagnoses:  CVA (cerebral vascular accident) [facility](HCC)  CVA (cerebral vascular accident) [facility](HCC)    New Prescriptions New Prescriptions   No medications on file     Corena HerterMumma, Kyrene Longan, MD 02/03/17 0020    Abelino DerrickMackuen, Courteney Lyn, MD 02/08/17 [phone]

## 2017-02-03 ENCOUNTER — Other Ambulatory Visit (HOSPITAL_COMMUNITY): Payer: Medicare HMO

## 2017-02-03 ENCOUNTER — Inpatient Hospital Stay (HOSPITAL_COMMUNITY): Payer: Medicare HMO

## 2017-02-03 DIAGNOSIS — I639 Cerebral infarction, unspecified: Secondary | ICD-10-CM

## 2017-02-03 LAB — GLUCOSE, CAPILLARY
GLUCOSE-CAPILLARY: 100 mg/dL — AB (ref 65–99)
GLUCOSE-CAPILLARY: 101 mg/dL — AB (ref 65–99)
GLUCOSE-CAPILLARY: 126 mg/dL — AB (ref 65–99)
GLUCOSE-CAPILLARY: 95 mg/dL (ref 65–99)
GLUCOSE-CAPILLARY: 99 mg/dL (ref 65–99)
Glucose-Capillary: 102 mg/dL — ABNORMAL HIGH (ref 65–99)

## 2017-02-03 LAB — TROPONIN I
TROPONIN I: 0.04 ng/mL — AB (ref <0.03)
TROPONIN I: 0.03 ng/mL — AB (ref <0.03)
Troponin I: 0.04 ng/mL (ref <0.03)

## 2017-02-03 LAB — BLOOD GAS, ARTERIAL
Acid-Base Excess: 2.4 mmol/L — ABNORMAL HIGH (ref 0.0–2.0)
BICARBONATE: 28.4 mmol/L — AB (ref 20.0–28.0)
DRAWN BY: 252031
O2 Content: 3 L/min
O2 Saturation: 98.2 %
PH ART: 7.287 — AB (ref 7.350–7.450)
Patient temperature: 98.6
pCO2 arterial: 61.5 mmHg — ABNORMAL HIGH (ref 32.0–48.0)
pO2, Arterial: 123 mmHg — ABNORMAL HIGH (ref 83.0–108.0)

## 2017-02-03 LAB — LIPID PANEL
CHOL/HDL RATIO: 6.7 ratio
CHOLESTEROL: 181 mg/dL (ref 0–200)
HDL: 27 mg/dL — ABNORMAL LOW (ref >40)
LDL Cholesterol: 121 mg/dL — ABNORMAL HIGH (ref 0–99)
TRIGLYCERIDES: 165 mg/dL — AB (ref <150)
VLDL: 33 mg/dL (ref 0–40)

## 2017-02-03 LAB — RAPID URINE DRUG SCREEN, HOSP PERFORMED
Amphetamines: NOT DETECTED
BARBITURATES: NOT DETECTED
Benzodiazepines: POSITIVE — AB
COCAINE: NOT DETECTED
Opiates: NOT DETECTED
Tetrahydrocannabinol: NOT DETECTED

## 2017-02-03 LAB — CREATININE, URINE, RANDOM: CREATININE, URINE: 34.9 mg/dL

## 2017-02-03 LAB — SODIUM, URINE, RANDOM: SODIUM UR: 105 mmol/L

## 2017-02-03 MED ORDER — PANTOPRAZOLE SODIUM 40 MG IV SOLR
40.0000 mg | Freq: Every day | INTRAVENOUS | Status: DC
  Administered 2017-02-03 (×2): 40 mg via INTRAVENOUS
  Filled 2017-02-03 (×2): qty 40

## 2017-02-03 MED ORDER — ACETAMINOPHEN 160 MG/5ML PO SOLN: 650.0000 mg | ORAL | Status: DC | PRN

## 2017-02-03 MED ORDER — ASPIRIN 300 MG RE SUPP
300.0000 mg | Freq: Every day | RECTAL | Status: DC
  Administered 2017-02-03: 300 mg via RECTAL
  Filled 2017-02-03 (×2): qty 1

## 2017-02-03 MED ORDER — ACETAMINOPHEN 650 MG RE SUPP: 650.0000 mg | RECTAL | Status: DC | PRN

## 2017-02-03 MED ORDER — ACETAMINOPHEN 325 MG PO TABS
650.0000 mg | ORAL_TABLET | ORAL | Status: DC | PRN
  Administered 2017-02-04 – 2017-02-06 (×2): 650 mg via ORAL
  Filled 2017-02-03 (×2): qty 2

## 2017-02-03 MED ORDER — ASPIRIN 325 MG PO TABS
325.0000 mg | ORAL_TABLET | Freq: Every day | ORAL | Status: DC
  Filled 2017-02-03 (×2): qty 1

## 2017-02-03 MED ORDER — DEXTROSE 10 % IV SOLN
INTRAVENOUS | Status: AC
  Administered 2017-02-03: 30 mL/h via INTRAVENOUS

## 2017-02-03 MED ORDER — LORAZEPAM 2 MG/ML IJ SOLN
0.5000 mg | Freq: Once | INTRAMUSCULAR | Status: AC
  Administered 2017-02-03: 0.5 mg via INTRAVENOUS

## 2017-02-03 MED ORDER — STROKE: EARLY STAGES OF RECOVERY BOOK
Freq: Once | Status: AC
  Administered 2017-02-03: 04:00:00
  Filled 2017-02-03: qty 1

## 2017-02-03 MED ORDER — LORAZEPAM 2 MG/ML IJ SOLN
INTRAMUSCULAR | Status: AC
  Filled 2017-02-03: qty 1

## 2017-02-03 MED ORDER — ALPRAZOLAM 0.25 MG PO TABS
0.2500 mg | ORAL_TABLET | Freq: Two times a day (BID) | ORAL | Status: DC | PRN
  Administered 2017-02-03: 0.25 mg via ORAL
  Filled 2017-02-03: qty 1

## 2017-02-03 MED ORDER — ALBUTEROL SULFATE (2.5 MG/3ML) 0.083% IN NEBU: 2.5000 mg | INHALATION_SOLUTION | Freq: Four times a day (QID) | RESPIRATORY_TRACT | Status: DC | PRN

## 2017-02-03 NOTE — Progress Notes (Signed)
PT Cancellation Note  Patient Details Name: Heather Walters MRN: [id number] DOB: 10-13-1931   Cancelled Treatment:    Reason Eval/Treat Not Completed: Patient not medically ready.  Pt on BIPAP and sedated.  Will try back tomorrow for eval as able. 02/03/2017  [address] BingKen [name], PT [phone]  (pager)   Eliseo GumKenneth V [name] 02/03/2017, 10:33 AM

## 2017-02-03 NOTE — Progress Notes (Signed)
*  PRELIMINARY RESULTS* Vascular Ultrasound Carotid Duplex (Doppler) has been completed.  Preliminary findings: Right ICA demonstrates a 1-39% stenosis.  Findings suggestive of a 40 - 59 percent stenosis involving the proximal left internal carotid artery. Very difficult evaluation of ICA due to bouncing vessels and color fill artifact. Bilateral vertebral arteries demonstrate patency and antegrade flow.  Chauncey FischerCharlotte C Paolo Okane 02/03/2017, 12:39 PM

## 2017-02-03 NOTE — Progress Notes (Signed)
Pt had been on BIPAP all day.  More alert  And BS clear.  Placed pt on 3L [place].  Vital signs stable.  Pt talking

## 2017-02-03 NOTE — Consult Note (Addendum)
Neurology Consultation Reason for Consult: Stroke Referring Physician: Adela Glimpseoutova, A  CC: Stroke  History is obtained from: Patient, family  HPI: Heather Walters is a 83 y.o. female with a history of dementia diagnosed last December who presents with worsening mental status over the past 24-48 hours. She was just [facility] after having been hospitalized there with nausea, found to have multiple polyps which were biopsied, A. fib with RVR. She was started on Eliquis, but then this was pulsed to biopsy multiple small colon polyps and she was recommended to be off Eliquis for 2 weeks.  Her family states that she was rather sleepy while in the hospital, and mildly confused. She has been much worse over the past 24 hours. Apparently, staff was not aware for presence there per previous notes.  On arrival, her blood glucose was 32.    ROS: A 14 point ROS was performed and is negative except as noted in the HPI.   Past Medical History:  Diagnosis Date  . Anxiety   . Arthritis   . Depression   . Fibromyalgia   . GERD (gastroesophageal reflux disease)   . Hyperlipidemia   . Hypertension   . Intervertebral disk disease    lumbar  . Mild aortic stenosis   . Neuropathy   . Stroke (HCC)   . Vitamin D deficiency      Family History  Problem Relation Age of Onset  . Diabetes Mother   . CAD Mother   . Cancer Mother   . Diabetes Father      Social History:  reports that she has never smoked. She has never used smokeless tobacco. She reports that she does not drink alcohol or use drugs.   Exam: Current vital signs: BP (!) 147/49   Pulse 70   Temp 97.6 F (36.4 C) (Rectal)   Resp (!) 24   Ht 5\' 1"  (1.549 m)   Wt 86.2 kg (190 lb)   SpO2 96%   BMI 35.90 kg/m  Vital signs in last 24 hours: Temp:  [97.6 F (36.4 C)] 97.6 F (36.4 C) (06/08 2049) Pulse Rate:  [70-75] 70 (06/08 2100) Resp:  [16-24] 24 (06/08 2100) BP: (147-166)/(49-50) 147/49  (06/08 2100) SpO2:  [95 %-98 %] 96 % (06/08 2100) Weight:  [86.2 kg (190 lb)] 86.2 kg (190 lb) (06/08 2031)   Physical Exam  Constitutional: Appears well-developed and well-nourished.  Psych: Affect appropriate to situation Eyes: No scleral injection HENT: No OP obstrucion Head: Normocephalic.  Cardiovascular: Normal rate and regular rhythm.  Respiratory: Effort normal  GI: Soft.  No distension. There is no tenderness.  Skin: WDI  Neuro: Mental Status: Patient is awake, alert, oriented to person, place,  year, and situation. She gives the month as March. Patient is able to give a clear and coherent history. No signs of aphasia or neglect Cranial Nerves: II: Visual Fields are full. Pupils are equal, round, and reactive to light.   III,IV, VI: EOMI without ptosis or diploplia.  V: Facial sensation is symmetric to temperature VII: Facial movement is symmetric.  VIII: hearing is intact to voice X: Uvula elevates symmetrically XI: Shoulder shrug is symmetric. XII: tongue is midline without atrophy or fasciculations.  Motor: Tone is normal. Bulk is normal. 5/5 strength was present in all four extremities.  Sensory: Sensation is symmetric to light touch and temperature in the arms and legs. Cerebellar: She does not have any clear ataxia on finger-nose-finger, but is slower on  the left than the right.   I have reviewed labs in epic and the results pertinent to this consultation are: TSH 5/30 at outside hospital-normal CMP-elevated creatinine  I have reviewed the images obtained: CT head-cerebellar infarct, this was not mentioned in the CT report from 5/28 at the outside hospital  Impression: 83 year old female with new cerebellar infarct. It is possible this has contributed some to her mental status, but I am not certain it explains all of it. I then there may be some hypoactive multifactorial delirium at play as well. She has been treated for HCAP, and likely has had poor by  mouth intake due to not being recognized as being at the facility the past 24 hours, as well as multiple undergarment changes in a patient with diagnosed dementia(though it is mild). She has AKI and therefore I'm hesitant to order a CT angiogram tonight, but if her creatinine trends down then I think CTA head and neck would be better than carotid Dopplers.  Recommendations: 1. HgbA1c, fasting lipid panel 2. MRI, MRA  of the brain without contrast 3. Frequent neuro checks 4. Echocardiogram 5. Carotid dopplers 6. Prophylactic therapy-Antiplatelet med: Aspirin - dose 325mg  PO or 300mg  PR 7. Risk factor modification 8. Telemetry monitoring 9. PT consult, OT consult, Speech consult 10. please page stroke NP  Or  PA  Or MD  from 8am -4 pm as this patient will be followed by the stroke team at this point.   You can look them up on [email or web address]     Ritta Slot, MD [facility] [phone]  If 7pm- 7am, please page neurology on call as listed in AMION.

## 2017-02-03 NOTE — Progress Notes (Signed)
OT Cancellation Note  Patient Details Name: Heather Walters MRN: [id number] DOB: 11/17/1931   Cancelled Treatment:    Reason Eval/Treat Not Completed: Medical issues which prohibited therapy. Pt on BIPAP and sedated.  Plan to reattempt tomorrow.  Raynald KempKathryn Makayia Duplessis OTR/L Pager: [phone]    02/03/2017, 12:19 PM

## 2017-02-03 NOTE — ED Notes (Signed)
Patient transported to MRI 

## 2017-02-03 NOTE — Progress Notes (Deleted)
Dr. Toniann FailKakrakandy is notifed about ABG results. Awaiting for the new orders to transfer the patient to SD unit and start bicarb drip. Continue to monitor the patient.

## 2017-02-03 NOTE — Progress Notes (Signed)
Patient says she feels just fine off bipap. Lung sounds are clear/diminshed on 3L with 02 saturations at 98%. BIPAP still on standby at bedside. RT will continue to monitor and place on bipap if needed.

## 2017-02-03 NOTE — Progress Notes (Signed)
STROKE TEAM PROGRESS NOTE   HISTORY OF PRESENT ILLNESS (per record) Heather Walters is a 83 y.o. female with a history of dementia diagnosed last December who presents with worsening mental status over the past 24-48 hours. She was just discharged from [facility] yesterday after having been hospitalized there with nausea, found to have multiple polyps which were biopsied, A. fib with RVR. She was started on Eliquis, but then this was pulsed to biopsy multiple small colon polyps and she was recommended to be off Eliquis for 2 weeks. Her family states that she was rather sleepy while in the hospital, and mildly confused. She has been much worse over the past 24 hours. Apparently, staff was not aware for presence there per previous notes. On arrival, her blood glucose was 32.    SUBJECTIVE (INTERVAL HISTORY) The patient's husband was at the bedside. He was pleased to hear that the patient had not suffered a stroke. The patient is improving and is able to follow commands.   OBJECTIVE Temp:  [97.6 F (36.4 C)-97.9 F (36.6 C)] 97.7 F (36.5 C) (06/09 0600) Pulse Rate:  [70-91] 83 (06/09 0740) Cardiac Rhythm: Normal sinus rhythm (06/09 1100) Resp:  [16-24] 20 (06/09 0740) BP: (147-178)/(42-61) 148/42 (06/09 0600) SpO2:  [94 %-98 %] 98 % (06/09 0740) Weight:  [86.2 kg (190 lb)] 86.2 kg (190 lb) (06/08 2031)  CBC:   Recent Labs Lab 02/02/17 2135  WBC 11.4*  HGB 9.6*  HCT 31.2*  MCV 94.0  PLT 293    Basic Metabolic Panel:   Recent Labs Lab 02/02/17 2135  NA 137  K 4.0  CL 103  CO2 24  GLUCOSE 153*  BUN 14  CREATININE 1.35*  CALCIUM 9.4    Lipid Panel:     Component Value Date/Time   CHOL 181 02/03/2017 0218   TRIG 165 (H) 02/03/2017 0218   HDL 27 (L) 02/03/2017 0218   CHOLHDL 6.7 02/03/2017 0218   VLDL 33 02/03/2017 0218   LDLCALC 121 (H) 02/03/2017 0218   HgbA1c: No results found for: HGBA1C Urine Drug Screen:     Component Value Date/Time   LABOPIA  NONE DETECTED 02/02/2017 2215   COCAINSCRNUR NONE DETECTED 02/02/2017 2215   LABBENZ POSITIVE (A) 02/02/2017 2215   AMPHETMU NONE DETECTED 02/02/2017 2215   THCU NONE DETECTED 02/02/2017 2215   LABBARB NONE DETECTED 02/02/2017 2215    Alcohol Level No results found for: [facility]  IMAGING  Dg Chest 2 View 02/02/2017 Shallow lung inflation and mild pulmonary edema.   Ct Head Wo Contrast 02/02/2017 Left cerebellar hypodensity compatible with infarct-likely acute to subacute. No evidence of hemorrhage. Mild chronic small-vessel white matter ischemic changes.   Mr Maxine Glenn Head Wo Contrast 02/03/2017  MRI HEAD 1. No acute intracranial infarct or other process identified.  2. Remote left cerebellar infarct. This accounts for previously noted hypodensity seen on prior CT.  3. Mild for age chronic small vessel ischemic disease.  4. Left mastoid effusion.   MRA HEAD 1. Normal MRA appearance of the large and medium sized vessels of the intracranial circulation. No large vessel occlusion. No high-grade or correctable stenosis.  2. Mild distal small vessel atheromatous irregularity.     PHYSICAL EXAM  Elderly caucasian lady on face mask and in mild resp distress. . Afebrile. Head is nontraumatic. Neck is supple without bruit.    Cardiac exam no murmur or gallop. Lungs are clear to auscultation. Distal pulses are well felt.  Neurological Exam ;  Awake  Alert oriented x 2. Soft hypophonic speech and language.eye movements full without nystagmus.fundi were not visualized. Vision acuity and fields appear normal. Hearing is normal. Palatal movements are normal. Face symmetric. Tongue midline. Able to move all 4 limbs equally against gravity. Normal sensation. Gait deferred.     ASSESSMENT/PLAN Heather Walters is a 83 y.o. female with history of previous stroke, dementia, neuropathy, aortic stenosis, hypertension, hyperlipidemia, fibromyalgia, anxiety and depression presenting with sleepiness and mild  confusion. She did not receive IV t-PA due to anticoagulation.  Likely delirium from multifactorial encephalopathy. Doubt TIA or stroke  Resultant  No focal deficits  CT head - Left cerebellar hypodensity compatible with infarct-likely acute to subacute.  MRI head - No acute intracranial infarct or other process identified.  Remote left cerebellar infarct.  MRA head - Normal MRA   Carotid Doppler - Pending  2D Echo - Pending  LDL - 121  HgbA1c - Pending  VTE prophylaxis - SCDs Diet NPO time specified  aspirin 81 mg daily and Eliquis (apixaban) daily prior to admission, now on aspirin 300 mg suppository daily  Patient counseled to be compliant with her antithrombotic medications  Ongoing aggressive stroke risk factor management  Therapy recommendations: Pending  Disposition: Pending  Hypertension  Stable  Permissive hypertension (OK if < 220/120) but gradually normalize in 5-7 days  Long-term BP goal normotensive  Hyperlipidemia  Home meds: Crestor 20 mg daily - not resumed in hospital  LDL 121, goal < 70  Start Crestor 40 mg daily when taking POs.  Continue statin at discharge  Diabetes  HgbA1c pending , goal < 7.0  Unc / Controlled  Other Stroke Risk Factors  Advanced age  Obesity, Body mass index is 35.9 kg/m., recommend weight loss, diet and exercise as appropriate   Hx stroke/TIA - Remote left cerebellar infarct by MRI  Atrial fibrillation - new diagnosis - Eliquis held secondary to biopsy of colon polyps.    Other Active Problems  Blood glucose reportedly 32 on admission. No history of diabetes and not on diabetic medications. Glucose fine here.  Colon polyps with recent biopsy  Dementia  Anemia  Mild leukocytosis  Mildly elevated creatinine - 1.53  Hospital day # 1  Delton Seeavid Rinehuls PA-C [facility] Pager [phone] 02/03/2017, 12:14 PM I have personally examined this patient, reviewed notes, independently  viewed imaging studies, participated in medical decision making and plan of care.ROS completed by me personally and pertinent positives fully documented  I have made any additions or clarifications directly to the above note. Agree with note above. The patient likely h altered mental status which is due to multifactorial delirium given baseline dementia and ongoing active medical problems. MRI does not show an acute stroke. I had a long discussion the patient's husband at the bedside and answered questions Greater than 50% and in this 35 minute visit was spent on counseling and coordination of care about her atrial fibrillation, risk for stroke on answering questions  Delia HeadyPramod Sethi, MD Medical Director [facility] Pager: [phone] 02/03/2017 12:44 PM   To contact Stroke Continuity provider, please refer to [email or web address]. After hours, contact General Neurology

## 2017-02-03 NOTE — Progress Notes (Signed)
PROGRESS NOTE    Heather Walters  ZOX:[id number]  DOB: 1932/04/01  DOA: 02/02/2017 PCP: Margaree Mackintosh, MD   Brief Admission Hx: Heather Walters is a 83 y.o. female with a history of dementia diagnosed last December who presents with worsening mental status over the past 24-48 hours. She was just discharged from [facility] yesterday after having been hospitalized there with nausea, found to have multiple polyps which were biopsied, A. fib with RVR. She was started on Eliquis, but then this was pulsed to biopsy multiple small colon polyps and she was recommended to be off Eliquis for 2 weeks.  Her family states that she was rather sleepy while in the hospital, and mildly confused. She has been much worse over the past 24 hours. Apparently, staff was not aware for presence there per previous notes.  On arrival, her blood glucose was 32.   MDM/Assessment & Plan:   Acute mental status changes - Repeat ABG done and shows respiratory acidosis, ordered to start bipap and transfer to SDU for closer monitoring.  Pt is DNR confirmed with husband. MRI with no acute CVA noted.  Stroke workup ordered, neurology following.  Severe hypoglycemia - Unknown cause, not known to be on insulin, D10 infusion x 12 hours.  Acute respiratory failure - respiratory acidosis, question if from lorazepam given for MRI.  BIPAP started.   Chronic diastolic CHF - follow I/O closely. Follow weights.  AKI - gently hydrate. Follow Cr.  OSA - on bipap Dementia - monitoring closely.  RLS - stable GERD - GI protection ordered.  Afib - recently started on eliquis but on hold secondary to recent colon biopsies (although family was confused and gave her an eliquis  Yesterday).  No GI bleeding noted.  Follow Hg.   DVT prophylaxis: SCDs Code Status: DNR Family Communication: bedside Disposition Plan: TBD   Consultants:  neurology  Subjective: Pt lethargic but arousable  Objective: Vitals:   02/02/17 2345  02/03/17 0152 02/03/17 0400 02/03/17 0600  BP: (!) 178/61 (!) 158/53 (!) 157/53 (!) 148/42  Pulse: 82  91   Resp: 19 18 20 18   Temp:  97.7 F (36.5 C) 97.9 F (36.6 C) 97.7 F (36.5 C)  TempSrc:  Oral Oral Oral  SpO2: 98% 96% 94% 94%  Weight:      Height:        Intake/Output Summary (Last 24 hours) at 02/03/17 8119 Last data filed at 02/03/17 0400  Gross per 24 hour  Intake            96.67 ml  Output                0 ml  Net            96.67 ml   Filed Weights   02/02/17 2031  Weight: 86.2 kg (190 lb)     REVIEW OF SYSTEMS  As per history otherwise all reviewed and reported negative  Exam:  General exam: chronically ill appearing, pale.   Respiratory system: shallow breathing.  Cardiovascular system: S1 & S2 heard. N Gastrointestinal system: Abdomen is nondistended, soft and nontender. Normal bowel sounds heard. Central nervous system: No focal neurological deficits. Extremities: diffuse edema.   Data Reviewed: Basic Metabolic Panel:  Recent Labs Lab 02/02/17 2135  NA 137  K 4.0  CL 103  CO2 24  GLUCOSE 153*  BUN 14  CREATININE 1.35*  CALCIUM 9.4   Liver Function Tests:  Recent Labs Lab 02/02/17 2135  AST  23  ALT 14  ALKPHOS 68  BILITOT 0.7  PROT 5.2*  ALBUMIN 2.4*   No results for input(s): LIPASE, AMYLASE in the last 168 hours.  Recent Labs Lab 02/02/17 2135  AMMONIA 27   CBC:  Recent Labs Lab 02/02/17 2135  WBC 11.4*  HGB 9.6*  HCT 31.2*  MCV 94.0  PLT 293   Cardiac Enzymes:  Recent Labs Lab 02/03/17 0218  TROPONINI 0.04*   CBG (last 3)   Recent Labs  02/03/17 0354 02/03/17 0558 02/03/17 0723  GLUCAP 99 100* 101*   No results found for this or any previous visit (from the past 240 hour(s)).   Studies: Dg Chest 2 View  Result Date: 02/02/2017 CLINICAL DATA:  Altered mental status EXAM: CHEST  2 VIEW COMPARISON:  None. FINDINGS: Shallow lung inflation. Bilateral opacities suggesting pulmonary edema. No  pneumothorax. Small left pleural effusion. Mild cardiomegaly. IMPRESSION: Shallow lung inflation and mild pulmonary edema. Electronically Signed   By: Deatra Robinson M.D.   On: 02/02/2017 21:30   Ct Head Wo Contrast  Result Date: 02/02/2017 CLINICAL DATA:  83 year old female with altered mental status. EXAM: CT HEAD WITHOUT CONTRAST TECHNIQUE: Contiguous axial images were obtained from the base of the skull through the vertex without intravenous contrast. COMPARISON:  None. FINDINGS: Brain: Hypodensity within the left cerebellum likely represents an acute to subacute infarct. There is no evidence of hemorrhage, midline shift or hydrocephalus. Mild chronic small-vessel white matter ischemic changes are noted. Vascular: Intracranial atherosclerotic calcifications noted. Skull: Normal. Negative for fracture or focal lesion. Sinuses/Orbits: No acute finding. Other: None. IMPRESSION: Left cerebellar hypodensity compatible with infarct-likely acute to subacute. No evidence of hemorrhage. Mild chronic small-vessel white matter ischemic changes. Electronically Signed   By: Harmon Pier M.D.   On: 02/02/2017 21:27   Mr Maxine Glenn Head Wo Contrast  Result Date: 02/03/2017 CLINICAL DATA:  Initial evaluation for worsening mental status. EXAM: MRI HEAD WITHOUT CONTRAST MRA HEAD WITHOUT CONTRAST TECHNIQUE: Multiplanar, multiecho pulse sequences of the brain and surrounding structures were obtained without intravenous contrast. Angiographic images of the head were obtained using MRA technique without contrast. COMPARISON:  Prior CT from 02/02/2017. FINDINGS: MRI HEAD FINDINGS Brain: Study limited as the patient was unable to tolerate the full length of the exam. Generalized age appropriate cerebral atrophy. T2/FLAIR hyperintensity within the periventricular and deep white matter both cerebral hemispheres most likely related to chronic small vessel ischemic disease, mild for age. Chronic microvascular changes present within the pons.  Patchy T2/FLAIR signal abnormality seen within the peripheral left cerebellar hemisphere noted (Series 11, image 6), most likely related to prior ischemic infarct. This corresponds with previously noted hypodensity on prior CT. No abnormal foci of restricted diffusion to suggest acute or subacute ischemia. Gray-white matter differentiation otherwise maintained. No other areas of encephalomalacia to suggest chronic infarction. No evidence for acute or chronic intracranial hemorrhage. No mass lesion, midline shift or mass effect. Ventricles normal size without evidence for hydrocephalus. No extra-axial fluid collection. Major dural sinuses are grossly patent. Midline structures intact and normal. Pituitary gland grossly unremarkable, although not well evaluated on this motion degraded exam. Vascular: Major intracranial vascular flow voids are maintained. Skull and upper cervical spine: Craniocervical junction within normal limits. Visualized upper cervical spine unremarkable. Bone marrow signal intensity normal. No scalp soft tissue abnormality. Sinuses/Orbits: Globes and orbital soft tissues within normal limits. Mild scattered mucosal thickening within the paranasal sinuses. No air-fluid level to suggest active sinus infection. Left mastoid effusion noted.  Nasopharynx grossly clear. Inner ear structures grossly unremarkable. MRA HEAD FINDINGS ANTERIOR CIRCULATION: Study moderately degraded by motion artifact. Distal cervical segments of the internal carotid arteries are patent with antegrade flow. Petrous, cavernous, and supraclinoid segments widely patent without flow-limiting stenosis. Ophthalmic arteries patent bilaterally. ICA termini widely patent. A1 segments well opacified bilaterally. Anterior cerebral artery is widely patent to their distal aspects. M1 segments patent without stenosis or occlusion. No proximal M2 occlusion. Distal MCA branches well opacified and symmetric. Mild small vessel atheromatous  irregularity. POSTERIOR CIRCULATION: Vertebral arteries codominant and widely patent to the vertebrobasilar junction. Posterior inferior cerebral arteries patent proximally. Basilar artery widely patent to its distal aspect. Superior cerebral arteries patent bilaterally. Both of the posterior cerebral artery is supplied primarily via the basilar and are widely patent to their distal aspects. Mild atheromatous irregularity within the PCAs bilaterally without high-grade stenosis. Small bilateral posterior communicating arteries noted No aneurysm or vascular malformation. IMPRESSION: MRI HEAD IMPRESSION: 1. No acute intracranial infarct or other process identified. 2. Remote left cerebellar infarct. This accounts for previously noted hypodensity seen on prior CT. 3. Mild for age chronic small vessel ischemic disease. 4. Left mastoid effusion. MRA HEAD IMPRESSION: 1. Normal MRA appearance of the large and medium sized vessels of the intracranial circulation. No large vessel occlusion. No high-grade or correctable stenosis. 2. Mild distal small vessel atheromatous irregularity. Electronically Signed   By: Rise MuBenjamin  McClintock M.D.   On: 02/03/2017 03:24   Mr Brain Wo Contrast  Result Date: 02/03/2017 CLINICAL DATA:  Initial evaluation for worsening mental status. EXAM: MRI HEAD WITHOUT CONTRAST MRA HEAD WITHOUT CONTRAST TECHNIQUE: Multiplanar, multiecho pulse sequences of the brain and surrounding structures were obtained without intravenous contrast. Angiographic images of the head were obtained using MRA technique without contrast. COMPARISON:  Prior CT from 02/02/2017. FINDINGS: MRI HEAD FINDINGS Brain: Study limited as the patient was unable to tolerate the full length of the exam. Generalized age appropriate cerebral atrophy. T2/FLAIR hyperintensity within the periventricular and deep white matter both cerebral hemispheres most likely related to chronic small vessel ischemic disease, mild for age. Chronic  microvascular changes present within the pons. Patchy T2/FLAIR signal abnormality seen within the peripheral left cerebellar hemisphere noted (Series 11, image 6), most likely related to prior ischemic infarct. This corresponds with previously noted hypodensity on prior CT. No abnormal foci of restricted diffusion to suggest acute or subacute ischemia. Gray-white matter differentiation otherwise maintained. No other areas of encephalomalacia to suggest chronic infarction. No evidence for acute or chronic intracranial hemorrhage. No mass lesion, midline shift or mass effect. Ventricles normal size without evidence for hydrocephalus. No extra-axial fluid collection. Major dural sinuses are grossly patent. Midline structures intact and normal. Pituitary gland grossly unremarkable, although not well evaluated on this motion degraded exam. Vascular: Major intracranial vascular flow voids are maintained. Skull and upper cervical spine: Craniocervical junction within normal limits. Visualized upper cervical spine unremarkable. Bone marrow signal intensity normal. No scalp soft tissue abnormality. Sinuses/Orbits: Globes and orbital soft tissues within normal limits. Mild scattered mucosal thickening within the paranasal sinuses. No air-fluid level to suggest active sinus infection. Left mastoid effusion noted. Nasopharynx grossly clear. Inner ear structures grossly unremarkable. MRA HEAD FINDINGS ANTERIOR CIRCULATION: Study moderately degraded by motion artifact. Distal cervical segments of the internal carotid arteries are patent with antegrade flow. Petrous, cavernous, and supraclinoid segments widely patent without flow-limiting stenosis. Ophthalmic arteries patent bilaterally. ICA termini widely patent. A1 segments well opacified bilaterally. Anterior cerebral artery is widely  patent to their distal aspects. M1 segments patent without stenosis or occlusion. No proximal M2 occlusion. Distal MCA branches well opacified  and symmetric. Mild small vessel atheromatous irregularity. POSTERIOR CIRCULATION: Vertebral arteries codominant and widely patent to the vertebrobasilar junction. Posterior inferior cerebral arteries patent proximally. Basilar artery widely patent to its distal aspect. Superior cerebral arteries patent bilaterally. Both of the posterior cerebral artery is supplied primarily via the basilar and are widely patent to their distal aspects. Mild atheromatous irregularity within the PCAs bilaterally without high-grade stenosis. Small bilateral posterior communicating arteries noted No aneurysm or vascular malformation. IMPRESSION: MRI HEAD IMPRESSION: 1. No acute intracranial infarct or other process identified. 2. Remote left cerebellar infarct. This accounts for previously noted hypodensity seen on prior CT. 3. Mild for age chronic small vessel ischemic disease. 4. Left mastoid effusion. MRA HEAD IMPRESSION: 1. Normal MRA appearance of the large and medium sized vessels of the intracranial circulation. No large vessel occlusion. No high-grade or correctable stenosis. 2. Mild distal small vessel atheromatous irregularity. Electronically Signed   By: Rise Mu M.D.   On: 02/03/2017 03:24   Scheduled Meds: . aspirin  300 mg Rectal Daily   Or  . aspirin  325 mg Oral Daily  . LORazepam      . pantoprazole (PROTONIX) IV  40 mg Intravenous QHS  . thiamine injection  100 mg Intravenous Daily   Continuous Infusions: . dextrose 5 % and 0.45% NaCl 50 mL/hr at 02/03/17 0204    Active Problems:   Hypertension   GERD (gastroesophageal reflux disease)   Hypoglycemia   Acute metabolic encephalopathy   A-fib (HCC)   Chronic diastolic CHF (congestive heart failure) (HCC)   Dehydration   CVA (cerebral vascular accident) (HCC)   AKI (acute kidney injury) [facility])  Critical Care Time spent: 38 mins  Standley Dakins, MD, FAAFP [facility] Pager [phone]  If 7PM-7AM, please contact  night-coverage [email or web address] Password [email or web address] 02/03/2017, 7:27 AM    LOS: 1 day

## 2017-02-03 NOTE — Progress Notes (Signed)
SLP Cancellation Note  Patient Details Name: Dionne Milothalee Dudding MRN: [id number] DOB: 12-02-1931   Cancelled treatment:       Reason Eval/Treat Not Completed: Medical issues which prohibited therapy. Orders received for cognitive-linguistic evaluation. Pt currently on BiPAP, will f/u next date. Please order swallow evaluation if appropriate.  Rondel BatonMary Beth Tymere Depuy, [place]MS, CCC-SLP Speech-Language Pathologist [phone]   Arlana LindauMary E Adella Manolis 02/03/2017, 2:00 PM

## 2017-02-03 NOTE — Progress Notes (Signed)
Patient's husband is currently at the bedside and updated about patient's current status.

## 2017-02-03 NOTE — Progress Notes (Signed)
Patient off bipap at this time on 3LNC. Discussed with patient about wearing BIPAP during the night and coming back off in morning. Patient agreed. RT will place on after nightly meds and monitor.

## 2017-02-03 NOTE — Progress Notes (Signed)
Patient is more lethargic and slow to respond. Dr. Amada JupiterKirkpatrick is notified and order received to draw ABG. Continue to monitor the patient.

## 2017-02-03 NOTE — Progress Notes (Signed)
Dr. Toniann FailKakrakandy is notifed about ABG results. Awaiting for the new orders to transfer the patient to SD unit and put on bipap. Continue to monitor the patient.

## 2017-02-04 ENCOUNTER — Encounter (HOSPITAL_COMMUNITY): Payer: Self-pay | Admitting: Family Medicine

## 2017-02-04 ENCOUNTER — Inpatient Hospital Stay (HOSPITAL_COMMUNITY): Payer: Medicare HMO

## 2017-02-04 DIAGNOSIS — I481 Persistent atrial fibrillation: Secondary | ICD-10-CM

## 2017-02-04 DIAGNOSIS — I639 Cerebral infarction, unspecified: Secondary | ICD-10-CM

## 2017-02-04 DIAGNOSIS — G9341 Metabolic encephalopathy: Principal | ICD-10-CM

## 2017-02-04 LAB — CBC WITH DIFFERENTIAL/PLATELET
BASOS ABS: 0 10*3/uL (ref 0.0–0.1)
BASOS PCT: 0 %
Eosinophils Absolute: 0.3 10*3/uL (ref 0.0–0.7)
Eosinophils Relative: 3 %
HEMATOCRIT: 29.4 % — AB (ref 36.0–46.0)
Hemoglobin: 9 g/dL — ABNORMAL LOW (ref 12.0–15.0)
Lymphocytes Relative: 25 %
Lymphs Abs: 2.7 10*3/uL (ref 0.7–4.0)
MCH: 29 pg (ref 26.0–34.0)
MCHC: 30.6 g/dL (ref 30.0–36.0)
MCV: 94.8 fL (ref 78.0–100.0)
MONO ABS: 0.8 10*3/uL (ref 0.1–1.0)
Monocytes Relative: 8 %
NEUTROS ABS: 6.9 10*3/uL (ref 1.7–7.7)
Neutrophils Relative %: 64 %
Platelets: 299 10*3/uL (ref 150–400)
RBC: 3.1 MIL/uL — AB (ref 3.87–5.11)
RDW: 14.2 % (ref 11.5–15.5)
WBC: 10.8 10*3/uL — AB (ref 4.0–10.5)

## 2017-02-04 LAB — GLUCOSE, CAPILLARY
GLUCOSE-CAPILLARY: 112 mg/dL — AB (ref 65–99)
GLUCOSE-CAPILLARY: 129 mg/dL — AB (ref 65–99)
Glucose-Capillary: 104 mg/dL — ABNORMAL HIGH (ref 65–99)
Glucose-Capillary: 107 mg/dL — ABNORMAL HIGH (ref 65–99)
Glucose-Capillary: 156 mg/dL — ABNORMAL HIGH (ref 65–99)

## 2017-02-04 LAB — COMPREHENSIVE METABOLIC PANEL
ALBUMIN: 2.2 g/dL — AB (ref 3.5–5.0)
ALK PHOS: 68 U/L (ref 38–126)
ALT: 14 U/L (ref 14–54)
ANION GAP: 8 (ref 5–15)
AST: 18 U/L (ref 15–41)
BILIRUBIN TOTAL: 0.6 mg/dL (ref 0.3–1.2)
BUN: 8 mg/dL (ref 6–20)
CALCIUM: 9.6 mg/dL (ref 8.9–10.3)
CO2: 30 mmol/L (ref 22–32)
Chloride: 101 mmol/L (ref 101–111)
Creatinine, Ser: 1.18 mg/dL — ABNORMAL HIGH (ref 0.44–1.00)
GFR calc Af Amer: 48 mL/min — ABNORMAL LOW (ref >60)
GFR, EST NON AFRICAN AMERICAN: 42 mL/min — AB (ref >60)
GLUCOSE: 105 mg/dL — AB (ref 65–99)
Potassium: 3.6 mmol/L (ref 3.5–5.1)
Sodium: 139 mmol/L (ref 135–145)
TOTAL PROTEIN: 4.8 g/dL — AB (ref 6.5–8.1)

## 2017-02-04 LAB — ECHOCARDIOGRAM COMPLETE
HEIGHTINCHES: 61 in
Weight: 3040 oz

## 2017-02-04 LAB — HEMOGLOBIN A1C
Hgb A1c MFr Bld: 5.4 % (ref 4.8–5.6)
Mean Plasma Glucose: 108 mg/dL

## 2017-02-04 MED ORDER — ROPINIROLE HCL 1 MG PO TABS
0.5000 mg | ORAL_TABLET | Freq: Every day | ORAL | Status: DC
  Administered 2017-02-04 – 2017-02-05 (×2): 0.5 mg via ORAL
  Filled 2017-02-04 (×2): qty 1

## 2017-02-04 MED ORDER — FERROUS SULFATE 325 (65 FE) MG PO TABS
325.0000 mg | ORAL_TABLET | Freq: Every day | ORAL | Status: DC
Start: 2017-02-04 — End: 2017-02-06
  Administered 2017-02-04 – 2017-02-06 (×3): 325 mg via ORAL
  Filled 2017-02-04 (×3): qty 1

## 2017-02-04 MED ORDER — ROSUVASTATIN CALCIUM 20 MG PO TABS
40.0000 mg | ORAL_TABLET | Freq: Every day | ORAL | Status: DC
  Administered 2017-02-04 – 2017-02-06 (×3): 40 mg via ORAL
  Filled 2017-02-04: qty 2
  Filled 2017-02-04: qty 1
  Filled 2017-02-04: qty 2

## 2017-02-04 MED ORDER — ISOSORBIDE MONONITRATE ER 60 MG PO TB24
60.0000 mg | ORAL_TABLET | Freq: Every day | ORAL | Status: DC
  Administered 2017-02-04 – 2017-02-05 (×2): 60 mg via ORAL
  Filled 2017-02-04 (×2): qty 1

## 2017-02-04 MED ORDER — ALPRAZOLAM 0.25 MG PO TABS
0.2500 mg | ORAL_TABLET | Freq: Three times a day (TID) | ORAL | Status: DC | PRN
  Administered 2017-02-04 – 2017-02-06 (×5): 0.25 mg via ORAL
  Filled 2017-02-04 (×6): qty 1

## 2017-02-04 MED ORDER — CARVEDILOL 6.25 MG PO TABS
6.2500 mg | ORAL_TABLET | Freq: Two times a day (BID) | ORAL | Status: DC
  Administered 2017-02-04 – 2017-02-05 (×3): 6.25 mg via ORAL
  Filled 2017-02-04 (×3): qty 1

## 2017-02-04 MED ORDER — FUROSEMIDE 20 MG PO TABS
20.0000 mg | ORAL_TABLET | Freq: Two times a day (BID) | ORAL | Status: DC
  Administered 2017-02-04 – 2017-02-05 (×3): 20 mg via ORAL
  Filled 2017-02-04 (×3): qty 1

## 2017-02-04 MED ORDER — AMIODARONE HCL 200 MG PO TABS
200.0000 mg | ORAL_TABLET | Freq: Every day | ORAL | Status: DC
  Administered 2017-02-04 – 2017-02-05 (×2): 200 mg via ORAL
  Filled 2017-02-04 (×2): qty 1

## 2017-02-04 MED ORDER — ONDANSETRON HCL 4 MG PO TABS
4.0000 mg | ORAL_TABLET | Freq: Three times a day (TID) | ORAL | Status: DC | PRN
  Administered 2017-02-04 – 2017-02-05 (×2): 4 mg via ORAL
  Filled 2017-02-04 (×2): qty 1

## 2017-02-04 MED ORDER — ASPIRIN EC 81 MG PO TBEC
81.0000 mg | DELAYED_RELEASE_TABLET | Freq: Every day | ORAL | Status: DC
  Administered 2017-02-04 – 2017-02-06 (×3): 81 mg via ORAL
  Filled 2017-02-04 (×3): qty 1

## 2017-02-04 MED ORDER — ADULT MULTIVITAMIN W/MINERALS CH
1.0000 | ORAL_TABLET | Freq: Every day | ORAL | Status: DC
  Administered 2017-02-04 – 2017-02-06 (×3): 1 via ORAL
  Filled 2017-02-04 (×3): qty 1

## 2017-02-04 MED ORDER — PANTOPRAZOLE SODIUM 40 MG PO TBEC
40.0000 mg | DELAYED_RELEASE_TABLET | Freq: Every day | ORAL | Status: DC
  Administered 2017-02-04 – 2017-02-06 (×3): 40 mg via ORAL
  Filled 2017-02-04 (×3): qty 1

## 2017-02-04 MED ORDER — TRAZODONE HCL 50 MG PO TABS
75.0000 mg | ORAL_TABLET | Freq: Every day | ORAL | Status: DC
  Administered 2017-02-04 – 2017-02-05 (×2): 75 mg via ORAL
  Filled 2017-02-04 (×2): qty 2

## 2017-02-04 MED ORDER — AMIODARONE HCL 200 MG PO TABS: 400.0000 mg | ORAL_TABLET | Freq: Two times a day (BID) | ORAL | Status: DC

## 2017-02-04 MED ORDER — NITROGLYCERIN 0.4 MG SL SUBL: 0.4000 mg | SUBLINGUAL_TABLET | SUBLINGUAL | Status: DC | PRN

## 2017-02-04 MED ORDER — AMLODIPINE BESYLATE 10 MG PO TABS
10.0000 mg | ORAL_TABLET | Freq: Every day | ORAL | Status: DC
  Administered 2017-02-04 – 2017-02-06 (×3): 10 mg via ORAL
  Filled 2017-02-04 (×3): qty 1

## 2017-02-04 NOTE — Evaluation (Signed)
Physical Therapy Evaluation Patient Details Name: Heather Walters MRN: [id number] DOB: 28-Jan-1932 Today's Date: 02/04/2017   History of Present Illness  Ms. Heather Walters is a 83 y.o. female with history of previous stroke, dementia, neuropathy, aortic stenosis, hypertension, hyperlipidemia, fibromyalgia, anxiety and depression presenting with sleepiness and mild confusion; was hypoglycemic upon arrival to ED  Clinical Impression   Pt admitted with above diagnosis. Pt currently with functional limitations due to the deficits listed below (see PT Problem List). Very anxious to move and fearful of falling; While SNF for post-acute rehab is my official recommendation, pt and granddaughter indicated they are hesitant to go SNF ([facility] for her to dc home, would recommend 24 hour at least mod assist;  Pt will benefit from skilled PT to increase their independence and safety with mobility to allow discharge to the venue listed below.       Follow Up Recommendations SNF;Other (comment) (Pt and family may decline SNF)    Equipment Recommendations  3in1 (PT)    Recommendations for Other Services       Precautions / Restrictions Precautions Precautions: Fall      Mobility  Bed Mobility Overal bed mobility: Needs Assistance Bed Mobility: Supine to Sit     Supine to sit: Mod assist     General bed mobility comments: Mod handheld assist to pull to sit  Transfers Overall transfer level: Needs assistance Equipment used: Rolling walker (2 wheeled) Transfers: Sit to/from Stand Sit to Stand: +2 physical assistance;Mod assist         General transfer comment: Heavy mod assis to power up and steady during transition of hands form bed to RW; cues for hand placement and safety  Ambulation/Gait Ambulation/Gait assistance: Mod assist;+2 safety/equipment Ambulation Distance (Feet): 10 Feet Assistive device: Rolling walker (2 wheeled) Gait Pattern/deviations:  Step-through pattern;Decreased step length - right;Decreased step length - left;Trunk flexed     General Gait Details: Very anxious with moving; cues and encouragement given and wheelchair pushed behind to boost confidence  Stairs            Wheelchair Mobility    Modified Rankin (Stroke Patients Only)       Balance                                             Pertinent Vitals/Pain Pain Assessment: Faces Pain Score: 10-Worst pain ever Faces Pain Scale: Hurts little more Pain Location: bil feet with standing; pt also reports chest pain -- she attributes this to anxiety; RN notified Pain Descriptors / Indicators: Aching;Burning;Grimacing Pain Intervention(s): Monitored during session    Home Living Family/patient expects to be discharged to:: Private residence Living Arrangements: Spouse/significant other Available Help at Discharge: Family Type of Home: House Home Access: Level entry     Home Layout: One level Home Equipment: Environmental consultantWalker - 4 wheels;Cane - single point      Prior Function Level of Independence: Needs assistance   Gait / Transfers Assistance Needed: walks with RW; per granddaughte, has not walked in about the 2 weeks leading up to today  ADL's / Homemaking Assistance Needed: husband assists with socks/shoes  Comments: reports ambulating with rollator or SPC in house, typically does not go into community     Hand Dominance        Extremity/Trunk Assessment   Upper Extremity Assessment Upper Extremity  Assessment: Generalized weakness    Lower Extremity Assessment Lower Extremity Assessment: Generalized weakness       Communication   Communication: No difficulties  Cognition Arousal/Alertness: Awake/alert Behavior During Therapy: WFL for tasks assessed/performed Overall Cognitive Status: Within Functional Limits for tasks assessed                                        General Comments       Exercises     Assessment/Plan    PT Assessment Patient needs continued PT services  PT Problem List Decreased strength;Decreased activity tolerance;Decreased balance;Decreased mobility;Decreased coordination;Decreased cognition;Decreased knowledge of use of DME;Decreased safety awareness;Decreased knowledge of precautions;Pain       PT Treatment Interventions DME instruction;Gait training;Functional mobility training;Therapeutic activities;Therapeutic exercise;Balance training;Patient/family education    PT Goals (Current goals can be found in the Care Plan section)  Acute Rehab PT Goals Patient Stated Goal: Indicated she is hesitant to return to SNF PT Goal Formulation: With patient Time For Goal Achievement: 02/18/17 Potential to Achieve Goals: Fair    Frequency Min 2X/week   Barriers to discharge        Co-evaluation               AM-PAC PT "6 Clicks" Daily Activity  Outcome Measure Difficulty turning over in bed (including adjusting bedclothes, sheets and blankets)?: Total Difficulty moving from lying on back to sitting on the side of the bed? : Total Difficulty sitting down on and standing up from a chair with arms (e.g., wheelchair, bedside commode, etc,.)?: Total Help needed moving to and from a bed to chair (including a wheelchair)?: A Lot Help needed walking in hospital room?: A Lot Help needed climbing 3-5 steps with a railing? : Total 6 Click Score: 8    End of Session Equipment Utilized During Treatment: Gait belt Activity Tolerance: Other (comment) (limited by anxiety) Patient left: Other (comment);with call bell/phone within reach;with family/visitor present (in wheelchair preparing to go to 5W) Nurse Communication: Mobility status PT Visit Diagnosis: Other abnormalities of gait and mobility (R26.89);Muscle weakness (generalized) (M62.81);Difficulty in walking, not elsewhere classified (R26.2)    Time: 1610-9604 PT Time Calculation (min) (ACUTE  ONLY): 25 min   Charges:   PT Evaluation $PT Eval Moderate Complexity: 1 Procedure PT Treatments $Gait Training: 8-22 mins   PT G Codes:        Van Clines, PT  Acute Rehabilitation Services Pager [phone] Office [phone]   Levi Aland 02/04/2017, 3:43 PM

## 2017-02-04 NOTE — Progress Notes (Signed)
Echocardiogram 2D Echocardiogram has been performed.  Dorothey BasemanReel, Janine Reller M 02/04/2017, 1:26 PM

## 2017-02-04 NOTE — Progress Notes (Signed)
PROGRESS NOTE    Heather Walters  WUJ:[id number]  DOB: 1931-11-22  DOA: 02/02/2017 PCP: Margaree Mackintosh, MD   Brief Admission Hx: Heather Walters is a 83 y.o. female with a history of dementia diagnosed last December who presents with worsening mental status over the past 24-48 hours. She was just discharged from [facility] yesterday after having been hospitalized there with nausea, found to have multiple polyps which were biopsied, A. fib with RVR. She was started on Eliquis, but then this was pulsed to biopsy multiple small colon polyps and she was recommended to be off Eliquis for 2 weeks.  Her family states that she was rather sleepy while in the hospital, and mildly confused. She has been much worse over the past 24 hours. Apparently, staff was not aware for presence there per previous notes.  On arrival, her blood glucose was 32.   MDM/Assessment & Plan:   Acute mental status changes - Resolved now, off bipap, mentating much better now.  Pt is DNR confirmed with husband. MRI with no acute CVA noted.   Neurology signed off.   Severe hypoglycemia - Unknown cause, not known to be on insulin, D10 infusion was used, BS stable now, will order diet this morning.  Acute respiratory failure - respiratory acidosis resolved now, treated with continuous bipap.  .   Chronic diastolic CHF - follow I/O closely. Follow weights.  Resume home lasix and other cardiac meds.  AKI - gently hydrated. Follow Cr. It has improved.   OSA - CPAP nightly. Dementia - monitoring closely. Family at bedside.   RLS - stable. Resume home meds.   GERD - GI protection ordered.  Afib - recently started on am[facility]arone at [facility], now should be on 200 mg daily [facility].  on eliquis but on hold secondary to recent colon biopsies (although family was confused and gave her an eliquis  Yesterday).  No GI bleeding noted.  Follow Hg. Eliquis should be restarted on 02/15/17.    DVT prophylaxis:  SCDs Code Status: DNR Family Communication: bedside Disposition Plan: Hopefully to SNF in 1-2 days  Consultants:  neurology  Subjective: Pt much more alert, oriented   Objective: Vitals:   02/03/17 1209 02/03/17 2128 02/03/17 2345 02/04/17 0400  BP:  (!) 173/64    Pulse: 85 96    Resp: 17 17    Temp:  98.5 F (36.9 C) 98.5 F (36.9 C) 98.7 F (37.1 C)  TempSrc:  Oral Oral Oral  SpO2: 97% 99%    Weight:      Height:        Intake/Output Summary (Last 24 hours) at 02/04/17 9562 Last data filed at 02/04/17 0000  Gross per 24 hour  Intake                5 ml  Output              750 ml  Net             -745 ml   Filed Weights   02/02/17 2031  Weight: 86.2 kg (190 lb)     REVIEW OF SYSTEMS  As per history otherwise all reviewed and reported negative  Exam:  General exam: chronically ill appearing, pale. Awake,alert.    Respiratory system: shallow breathing.  Cardiovascular system: S1 & S2 heard.  Gastrointestinal system: Abdomen is nondistended, soft and nontender. Normal bowel sounds heard. Central nervous system: No focal neurological deficits. Extremities: diffuse edema.  Data Reviewed: Basic Metabolic Panel:  Recent Labs Lab 02/02/17 2135 02/04/17 0140  NA 137 139  K 4.0 3.6  CL 103 101  CO2 24 30  GLUCOSE 153* 105*  BUN 14 8  CREATININE 1.35* 1.18*  CALCIUM 9.4 9.6   Liver Function Tests:  Recent Labs Lab 02/02/17 2135 02/04/17 0140  AST 23 18  ALT 14 14  ALKPHOS 68 68  BILITOT 0.7 0.6  PROT 5.2* 4.8*  ALBUMIN 2.4* 2.2*   No results for input(s): LIPASE, AMYLASE in the last 168 hours.  Recent Labs Lab 02/02/17 2135  AMMONIA 27   CBC:  Recent Labs Lab 02/02/17 2135 02/04/17 0140  WBC 11.4* 10.8*  NEUTROABS  --  6.9  HGB 9.6* 9.0*  HCT 31.2* 29.4*  MCV 94.0 94.8  PLT 293 299   Cardiac Enzymes:  Recent Labs Lab 02/03/17 0218 02/03/17 0609 02/03/17 1257  TROPONINI 0.04* 0.04* 0.03*   CBG (last 3)   Recent  Labs  02/03/17 2209 02/03/17 2343 02/04/17 0412  GLUCAP 102* 126* 104*   No results found for this or any previous visit (from the past 240 hour(s)).   Studies: Dg Chest 2 View  Result Date: 02/02/2017 CLINICAL DATA:  Altered mental status EXAM: CHEST  2 VIEW COMPARISON:  None. FINDINGS: Shallow lung inflation. Bilateral opacities suggesting pulmonary edema. No pneumothorax. Small left pleural effusion. Mild cardiomegaly. IMPRESSION: Shallow lung inflation and mild pulmonary edema. Electronically Signed   By: Deatra RobinsonKevin  Herman M.D.   On: 02/02/2017 21:30   Ct Head Wo Contrast  Result Date: 02/02/2017 CLINICAL DATA:  83 year old female with altered mental status. EXAM: CT HEAD WITHOUT CONTRAST TECHNIQUE: Contiguous axial images were obtained from the base of the skull through the vertex without intravenous contrast. COMPARISON:  None. FINDINGS: Brain: Hypodensity within the left cerebellum likely represents an acute to subacute infarct. There is no evidence of hemorrhage, midline shift or hydrocephalus. Mild chronic small-vessel white matter ischemic changes are noted. Vascular: Intracranial atherosclerotic calcifications noted. Skull: Normal. Negative for fracture or focal lesion. Sinuses/Orbits: No acute finding. Other: None. IMPRESSION: Left cerebellar hypodensity compatible with infarct-likely acute to subacute. No evidence of hemorrhage. Mild chronic small-vessel white matter ischemic changes. Electronically Signed   By: Harmon PierJeffrey  Hu M.D.   On: 02/02/2017 21:27   Mr Maxine GlennMra Head Wo Contrast  Result Date: 02/03/2017 CLINICAL DATA:  Initial evaluation for worsening mental status. EXAM: MRI HEAD WITHOUT CONTRAST MRA HEAD WITHOUT CONTRAST TECHNIQUE: Multiplanar, multiecho pulse sequences of the brain and surrounding structures were obtained without intravenous contrast. Angiographic images of the head were obtained using MRA technique without contrast. COMPARISON:  Prior CT from 02/02/2017. FINDINGS: MRI  HEAD FINDINGS Brain: Study limited as the patient was unable to tolerate the full length of the exam. Generalized age appropriate cerebral atrophy. T2/FLAIR hyperintensity within the periventricular and deep white matter both cerebral hemispheres most likely related to chronic small vessel ischemic disease, mild for age. Chronic microvascular changes present within the pons. Patchy T2/FLAIR signal abnormality seen within the peripheral left cerebellar hemisphere noted (Series 11, image 6), most likely related to prior ischemic infarct. This corresponds with previously noted hypodensity on prior CT. No abnormal foci of restricted diffusion to suggest acute or subacute ischemia. Gray-white matter differentiation otherwise maintained. No other areas of encephalomalacia to suggest chronic infarction. No evidence for acute or chronic intracranial hemorrhage. No mass lesion, midline shift or mass effect. Ventricles normal size without evidence for hydrocephalus. No extra-axial fluid collection. Major  dural sinuses are grossly patent. Midline structures intact and normal. Pituitary gland grossly unremarkable, although not well evaluated on this motion degraded exam. Vascular: Major intracranial vascular flow voids are maintained. Skull and upper cervical spine: Craniocervical junction within normal limits. Visualized upper cervical spine unremarkable. Bone marrow signal intensity normal. No scalp soft tissue abnormality. Sinuses/Orbits: Globes and orbital soft tissues within normal limits. Mild scattered mucosal thickening within the paranasal sinuses. No air-fluid level to suggest active sinus infection. Left mastoid effusion noted. Nasopharynx grossly clear. Inner ear structures grossly unremarkable. MRA HEAD FINDINGS ANTERIOR CIRCULATION: Study moderately degraded by motion artifact. Distal cervical segments of the internal carotid arteries are patent with antegrade flow. Petrous, cavernous, and supraclinoid segments  widely patent without flow-limiting stenosis. Ophthalmic arteries patent bilaterally. ICA termini widely patent. A1 segments well opacified bilaterally. Anterior cerebral artery is widely patent to their distal aspects. M1 segments patent without stenosis or occlusion. No proximal M2 occlusion. Distal MCA branches well opacified and symmetric. Mild small vessel atheromatous irregularity. POSTERIOR CIRCULATION: Vertebral arteries codominant and widely patent to the vertebrobasilar junction. Posterior inferior cerebral arteries patent proximally. Basilar artery widely patent to its distal aspect. Superior cerebral arteries patent bilaterally. Both of the posterior cerebral artery is supplied primarily via the basilar and are widely patent to their distal aspects. Mild atheromatous irregularity within the PCAs bilaterally without high-grade stenosis. Small bilateral posterior communicating arteries noted No aneurysm or vascular malformation. IMPRESSION: MRI HEAD IMPRESSION: 1. No acute intracranial infarct or other process identified. 2. Remote left cerebellar infarct. This accounts for previously noted hypodensity seen on prior CT. 3. Mild for age chronic small vessel ischemic disease. 4. Left mastoid effusion. MRA HEAD IMPRESSION: 1. Normal MRA appearance of the large and medium sized vessels of the intracranial circulation. No large vessel occlusion. No high-grade or correctable stenosis. 2. Mild distal small vessel atheromatous irregularity. Electronically Signed   By: Rise Mu M.D.   On: 02/03/2017 03:24   Mr Brain Wo Contrast  Result Date: 02/03/2017 CLINICAL DATA:  Initial evaluation for worsening mental status. EXAM: MRI HEAD WITHOUT CONTRAST MRA HEAD WITHOUT CONTRAST TECHNIQUE: Multiplanar, multiecho pulse sequences of the brain and surrounding structures were obtained without intravenous contrast. Angiographic images of the head were obtained using MRA technique without contrast. COMPARISON:   Prior CT from 02/02/2017. FINDINGS: MRI HEAD FINDINGS Brain: Study limited as the patient was unable to tolerate the full length of the exam. Generalized age appropriate cerebral atrophy. T2/FLAIR hyperintensity within the periventricular and deep white matter both cerebral hemispheres most likely related to chronic small vessel ischemic disease, mild for age. Chronic microvascular changes present within the pons. Patchy T2/FLAIR signal abnormality seen within the peripheral left cerebellar hemisphere noted (Series 11, image 6), most likely related to prior ischemic infarct. This corresponds with previously noted hypodensity on prior CT. No abnormal foci of restricted diffusion to suggest acute or subacute ischemia. Gray-white matter differentiation otherwise maintained. No other areas of encephalomalacia to suggest chronic infarction. No evidence for acute or chronic intracranial hemorrhage. No mass lesion, midline shift or mass effect. Ventricles normal size without evidence for hydrocephalus. No extra-axial fluid collection. Major dural sinuses are grossly patent. Midline structures intact and normal. Pituitary gland grossly unremarkable, although not well evaluated on this motion degraded exam. Vascular: Major intracranial vascular flow voids are maintained. Skull and upper cervical spine: Craniocervical junction within normal limits. Visualized upper cervical spine unremarkable. Bone marrow signal intensity normal. No scalp soft tissue abnormality. Sinuses/Orbits: Globes and orbital  soft tissues within normal limits. Mild scattered mucosal thickening within the paranasal sinuses. No air-fluid level to suggest active sinus infection. Left mastoid effusion noted. Nasopharynx grossly clear. Inner ear structures grossly unremarkable. MRA HEAD FINDINGS ANTERIOR CIRCULATION: Study moderately degraded by motion artifact. Distal cervical segments of the internal carotid arteries are patent with antegrade flow. Petrous,  cavernous, and supraclinoid segments widely patent without flow-limiting stenosis. Ophthalmic arteries patent bilaterally. ICA termini widely patent. A1 segments well opacified bilaterally. Anterior cerebral artery is widely patent to their distal aspects. M1 segments patent without stenosis or occlusion. No proximal M2 occlusion. Distal MCA branches well opacified and symmetric. Mild small vessel atheromatous irregularity. POSTERIOR CIRCULATION: Vertebral arteries codominant and widely patent to the vertebrobasilar junction. Posterior inferior cerebral arteries patent proximally. Basilar artery widely patent to its distal aspect. Superior cerebral arteries patent bilaterally. Both of the posterior cerebral artery is supplied primarily via the basilar and are widely patent to their distal aspects. Mild atheromatous irregularity within the PCAs bilaterally without high-grade stenosis. Small bilateral posterior communicating arteries noted No aneurysm or vascular malformation. IMPRESSION: MRI HEAD IMPRESSION: 1. No acute intracranial infarct or other process identified. 2. Remote left cerebellar infarct. This accounts for previously noted hypodensity seen on prior CT. 3. Mild for age chronic small vessel ischemic disease. 4. Left mastoid effusion. MRA HEAD IMPRESSION: 1. Normal MRA appearance of the large and medium sized vessels of the intracranial circulation. No large vessel occlusion. No high-grade or correctable stenosis. 2. Mild distal small vessel atheromatous irregularity. Electronically Signed   By: Rise Mu M.D.   On: 02/03/2017 03:24   Dg Chest Port 1 View  Result Date: 02/04/2017 CLINICAL DATA:  Congestive heart failure. EXAM: PORTABLE CHEST 1 VIEW COMPARISON:  02/02/2017 radiographs FINDINGS: Cardiomegaly and bilateral airspace opacities and trace bilateral pleural effusions again noted. There is no evidence of pneumothorax. There has been little interval change since the prior study.  IMPRESSION: Unchanged appearance of the chest with bilateral airspace opacities and trace bilateral pleural effusions. Electronically Signed   By: Harmon Pier M.D.   On: 02/04/2017 07:47   Scheduled Meds: . amiodarone  400 mg Oral BID  . amLODipine  10 mg Oral Daily  . aspirin EC  81 mg Oral Daily  . carvedilol  6.25 mg Oral BID WC  . ferrous sulfate  325 mg Oral Q breakfast  . furosemide  20 mg Oral BID  . isosorbide mononitrate  60 mg Oral Daily  . multivitamin with minerals  1 tablet Oral Daily  . pantoprazole  40 mg Oral Daily  . rOPINIRole  0.5 mg Oral QHS  . rosuvastatin  40 mg Oral Daily  . traZODone  75 mg Oral QHS   Continuous Infusions:   Active Problems:   Hypertension   GERD (gastroesophageal reflux disease)   Hypoglycemia   Acute metabolic encephalopathy   A-fib (HCC)   Chronic diastolic CHF (congestive heart failure) (HCC)   Dehydration   CVA (cerebral vascular accident) (HCC)   AKI (acute kidney injury) [facility])  Critical Care Time spent: 35 mins  Standley Dakins, MD, FAAFP [facility] Pager [phone]  If 7PM-7AM, please contact night-coverage [email or web address] Password [email or web address] 02/04/2017, 8:26 AM    LOS: 2 days

## 2017-02-04 NOTE — Evaluation (Addendum)
Clinical/Bedside Swallow Evaluation Patient Details  Name: Heather Walters MRN: [id number] Date of Birth: March 06, 1932  Today's Date: 02/04/2017 Time: SLP Start Time (ACUTE ONLY): 4098 SLP Stop Time (ACUTE ONLY): 0938 SLP Time Calculation (min) (ACUTE ONLY): 13 min  Past Medical History:  Past Medical History:  Diagnosis Date  . Anxiety   . Arthritis   . Depression   . Fibromyalgia   . GERD (gastroesophageal reflux disease)   . Hyperlipidemia   . Hypertension   . Intervertebral disk disease    lumbar  . Mild aortic stenosis   . Neuropathy   . Stroke (HCC)   . Vitamin D deficiency    Past Surgical History:  Past Surgical History:  Procedure Laterality Date  . ABDOMINAL HYSTERECTOMY    . CHOLECYSTECTOMY    . COLONOSCOPY    . ESOPHAGOGASTRODUODENOSCOPY ENDOSCOPY    . JOINT REPLACEMENT     right total knee   HPI:  Heather Walters a 83 y.o.femalewith a history of dementia diagnosed last December who presents with worsening mental status and poorly responsive at SNF. Per chart pt discharged from [facility] yesterday after having been hospitalized there with nausea, found to have multiple polyps which were biopsied, A. fib with RVR. PMH: CVA, HTN, GERD, fibromyalgia. MRI No acute intracranial infarct or other process identified, remote left cerebellar infarct. This accounts for previously noted hypodensity seen on prior CT. CSXR Unchanged appearance of the chest with bilateral airspace opacities and trace bilateral pleural effusions.   Assessment / Plan / Recommendation Clinical Impression  Pt report occassional pharyngeal globus sensation typically indicative of esophageal componenet (has hx GERD). No s/s aspiration across consistencies and no globus sensation during assessment. Spouse states occasional cough if lying back in recliner when eating. Educated pt re: reflux precautions and continuation of regular diet and thin liquids, pills with liquids. No follow up needed  for swallow.   SLP Visit Diagnosis: Dysphagia, unspecified (R13.10)    Aspiration Risk  Mild aspiration risk    Diet Recommendation Regular;Thin liquid   Liquid Administration via: Cup;Straw Medication Administration: Whole meds with liquid Supervision: Patient able to self feed Compensations: Slow rate;Small sips/bites;Follow solids with liquid Postural Changes: Seated upright at 90 degrees;Remain upright for at least 30 minutes after po intake    Other  Recommendations Oral Care Recommendations: Oral care BID   Follow up Recommendations None      Frequency and Duration            Prognosis        Swallow Study   General HPI: Heather Walters a 83 y.o.femalewith a history of dementia diagnosed last December who presents with worsening mental status and poorly responsive at SNF. Per chart pt discharged from [facility] yesterday after having been hospitalized there with nausea, found to have multiple polyps which were biopsied, A. fib with RVR. PMH: CVA, HTN, GERD, fibromyalgia. MRI No acute intracranial infarct or other process identified, remote left cerebellar infarct. This accounts for previously noted hypodensity seen on prior CT. CSXR Unchanged appearance of the chest with bilateral airspace opacities and trace bilateral pleural effusions. Type of Study: Bedside Swallow Evaluation Previous Swallow Assessment:  (none) Diet Prior to this Study: Regular;Thin liquids Temperature Spikes Noted: No Respiratory Status: Nasal cannula History of Recent Intubation: No Behavior/Cognition: Alert;Cooperative;Pleasant mood Oral Cavity Assessment: Within Functional Limits Oral Care Completed by SLP: No Oral Cavity - Dentition: Adequate natural dentition Vision: Functional for self-feeding Self-Feeding Abilities: Able to feed self;Needs set up  Patient Positioning: Upright in bed Baseline Vocal Quality: Normal Volitional Cough: Strong Volitional Swallow: Able to elicit     Oral/Motor/Sensory Function Overall Oral Motor/Sensory Function: Within functional limits   Ice Chips Ice chips: Not tested   Thin Liquid Thin Liquid: Within functional limits Presentation: Cup;Straw    Nectar Thick Nectar Thick Liquid: Not tested   Honey Thick Honey Thick Liquid: Not tested   Puree Puree: Within functional limits   Solid   GO   Solid: Within functional limits        Heather Walters, Heather Walters 02/04/2017,9:43 AM  Heather Walters M.Ed [employer] Pager [phone]

## 2017-02-04 NOTE — Progress Notes (Signed)
Patient being transferred to 5W25. Report called to Glen CampbellLonnie, Charity fundraiserN. Family at bedside and is aware of patient transferring. All belongings gathered and taken with patient. All questions answered. Care deferred to 5W.

## 2017-02-04 NOTE — Progress Notes (Signed)
STROKE TEAM PROGRESS NOTE   HISTORY OF PRESENT ILLNESS (per record) Heather Walters is a 83 y.o. female with a history of dementia diagnosed last December who presents with worsening mental status over the past 24-48 hours. She was just discharged from [facility] yesterday after having been hospitalized there with nausea, found to have multiple polyps which were biopsied, A. fib with RVR. She was started on Eliquis, but then this was pulsed to biopsy multiple small colon polyps and she was recommended to be off Eliquis for 2 weeks. Her family states that she was rather sleepy while in the hospital, and mildly confused. She has been much worse over the past 24 hours. Apparently, staff was not aware for presence there per previous notes. On arrival, her blood glucose was 32.    SUBJECTIVE (INTERVAL HISTORY) The patient's husband is at the bedside. She does not remember why she is in the hospital. Husband feels she is improved a lot and is almost back to her baseline OBJECTIVE Temp:  [98.5 F (36.9 C)-98.8 F (37.1 C)] 98.8 F (37.1 C) (06/10 0900) Pulse Rate:  [85-96] 87 (06/10 0900) Cardiac Rhythm: Normal sinus rhythm (06/10 0709) Resp:  [17-22] 19 (06/10 0900) BP: (148-173)/(64-124) 173/66 (06/10 0900) SpO2:  [97 %-99 %] 98 % (06/10 0900) FiO2 (%):  [35 %] 35 % (06/09 1209)  CBC:   Recent Labs Lab 02/02/17 2135 02/04/17 0140  WBC 11.4* 10.8*  NEUTROABS  --  6.9  HGB 9.6* 9.0*  HCT 31.2* 29.4*  MCV 94.0 94.8  PLT 293 299    Basic Metabolic Panel:   Recent Labs Lab 02/02/17 2135 02/04/17 0140  NA 137 139  K 4.0 3.6  CL 103 101  CO2 24 30  GLUCOSE 153* 105*  BUN 14 8  CREATININE 1.35* 1.18*  CALCIUM 9.4 9.6    Lipid Panel:     Component Value Date/Time   CHOL 181 02/03/2017 0218   TRIG 165 (H) 02/03/2017 0218   HDL 27 (L) 02/03/2017 0218   CHOLHDL 6.7 02/03/2017 0218   VLDL 33 02/03/2017 0218   LDLCALC 121 (H) 02/03/2017 0218   HgbA1c:  Lab  Results  Component Value Date   HGBA1C 5.4 02/03/2017   Urine Drug Screen:     Component Value Date/Time   LABOPIA NONE DETECTED 02/02/2017 2215   COCAINSCRNUR NONE DETECTED 02/02/2017 2215   LABBENZ POSITIVE (A) 02/02/2017 2215   AMPHETMU NONE DETECTED 02/02/2017 2215   THCU NONE DETECTED 02/02/2017 2215   LABBARB NONE DETECTED 02/02/2017 2215    Alcohol Level No results found for: [facility]  IMAGING  Dg Chest 2 View 02/02/2017 Shallow lung inflation and mild pulmonary edema.   Ct Head Wo Contrast 02/02/2017 Left cerebellar hypodensity compatible with infarct-likely acute to subacute. No evidence of hemorrhage. Mild chronic small-vessel white matter ischemic changes.   Mr Maxine Glenn Head Wo Contrast 02/03/2017  MRI HEAD 1. No acute intracranial infarct or other process identified.  2. Remote left cerebellar infarct. This accounts for previously noted hypodensity seen on prior CT.  3. Mild for age chronic small vessel ischemic disease.  4. Left mastoid effusion.   MRA HEAD 1. Normal MRA appearance of the large and medium sized vessels of the intracranial circulation. No large vessel occlusion. No high-grade or correctable stenosis.  2. Mild distal small vessel atheromatous irregularity.     PHYSICAL EXAM  Elderly caucasian lady on face mask and in mild resp distress. . Afebrile. Head is nontraumatic. Neck  is supple without bruit.    Cardiac exam no murmur or gallop. Lungs are clear to auscultation. Distal pulses are well felt.  Neurological Exam ;  Awake  Alert oriented x 2. Soft hypophonic speech and language.eye movements full without nystagmus.fundi were not visualized. Vision acuity and fields appear normal. Hearing is normal. Palatal movements are normal. Face symmetric. Tongue midline. Able to move all 4 limbs equally against gravity. Normal sensation. Gait deferred.     ASSESSMENT/PLAN Ms. Heather Walters is a 83 y.o. female with history of previous stroke, dementia,  neuropathy, aortic stenosis, hypertension, hyperlipidemia, fibromyalgia, anxiety and depression presenting with sleepiness and mild confusion. She did not receive IV t-PA due to anticoagulation.  Likely delirium from multifactorial encephalopathy. Doubt TIA or stroke  Resultant  No focal deficits  CT head - Left cerebellar hypodensity compatible with infarct-likely acute to subacute.  MRI head - No acute intracranial infarct or other process identified.  Remote left cerebellar infarct.  MRA head - Normal MRA   Carotid Doppler - 40-59% left ICA and 1-39% right ICA stenosis.  2D Echo - Pending  LDL - 121  HgbA1c - Pending  VTE prophylaxis - SCDs Diet Heart Room service appropriate? Yes; Fluid consistency: Thin  aspirin 81 mg daily and Eliquis (apixaban) daily prior to admission, now on aspirin 300 mg suppository daily  Patient counseled to be compliant with her antithrombotic medications  Ongoing aggressive stroke risk factor management  Therapy recommendations: Pending  Disposition: Pending  Hypertension  Stable  Permissive hypertension (OK if < 220/120) but gradually normalize in 5-7 days  Long-term BP goal normotensive  Hyperlipidemia  Home meds: Crestor 20 mg daily - not resumed in hospital  LDL 121, goal < 70  Start Crestor 40 mg daily when taking POs.  Continue statin at discharge  Diabetes  HgbA1c pending , goal < 7.0  Unc / Controlled  Other Stroke Risk Factors  Advanced age  Obesity, Body mass index is 35.9 kg/m., recommend weight loss, diet and exercise as appropriate   Hx stroke/TIA - Remote left cerebellar infarct by MRI  Atrial fibrillation - new diagnosis - Eliquis held secondary to biopsy of colon polyps.    Other Active Problems  Blood glucose reportedly 32 on admission. No history of diabetes and not on diabetic medications. Glucose fine here.  Colon polyps with recent biopsy  Dementia  Anemia  Mild leukocytosis  Mildly  elevated creatinine - 1.53  Hospital day # 2    I have personally examined this patient, reviewed notes, independently viewed imaging studies, participated in medical decision making and plan of care.ROS completed by me personally and pertinent positives fully documented  I have made any additions or clarifications directly to the above note. Agree with note above. The patient likely had altered mental status which is due to multifactorial delirium given baseline dementia and ongoing active medical problems. MRI does not show an acute stroke. I had a long discussion the patient's husband at the bedside and answered questions. Check echocardiogram. Continue statin for elevated LDL. Stroke team will sign off. Kindly call for questions. Discussed with Dr. Laural BenesJohnson Greater than 50% and in this 25 minute visit was spent on counseling and coordination of care about her atrial fibrillation, risk for stroke on answering questions  Delia HeadyPramod Sethi, MD Medical Director [facility] Pager: [phone] 02/04/2017 11:51 AM   To contact Stroke Continuity provider, please refer to [email or web address]. After hours, contact General Neurology

## 2017-02-05 ENCOUNTER — Encounter (HOSPITAL_COMMUNITY): Payer: Self-pay | Admitting: Cardiology

## 2017-02-05 ENCOUNTER — Inpatient Hospital Stay (HOSPITAL_COMMUNITY): Payer: Medicare HMO

## 2017-02-05 DIAGNOSIS — I10 Essential (primary) hypertension: Secondary | ICD-10-CM

## 2017-02-05 DIAGNOSIS — I481 Persistent atrial fibrillation: Secondary | ICD-10-CM

## 2017-02-05 DIAGNOSIS — I5032 Chronic diastolic (congestive) heart failure: Secondary | ICD-10-CM

## 2017-02-05 LAB — COMPREHENSIVE METABOLIC PANEL
ALBUMIN: 2.3 g/dL — AB (ref 3.5–5.0)
ALK PHOS: 72 U/L (ref 38–126)
ALT: 14 U/L (ref 14–54)
AST: 21 U/L (ref 15–41)
Anion gap: 12 (ref 5–15)
BUN: 10 mg/dL (ref 6–20)
CALCIUM: 9.5 mg/dL (ref 8.9–10.3)
CO2: 31 mmol/L (ref 22–32)
Chloride: 97 mmol/L — ABNORMAL LOW (ref 101–111)
Creatinine, Ser: 1.24 mg/dL — ABNORMAL HIGH (ref 0.44–1.00)
GFR, EST AFRICAN AMERICAN: 46 mL/min — AB (ref >60)
GFR, EST NON AFRICAN AMERICAN: 39 mL/min — AB (ref >60)
GLUCOSE: 133 mg/dL — AB (ref 65–99)
POTASSIUM: 3.2 mmol/L — AB (ref 3.5–5.1)
Sodium: 140 mmol/L (ref 135–145)
Total Bilirubin: 0.6 mg/dL (ref 0.3–1.2)
Total Protein: 4.9 g/dL — ABNORMAL LOW (ref 6.5–8.1)

## 2017-02-05 LAB — MAGNESIUM: MAGNESIUM: 1 mg/dL — AB (ref 1.7–2.4)

## 2017-02-05 LAB — GLUCOSE, CAPILLARY
GLUCOSE-CAPILLARY: 116 mg/dL — AB (ref 65–99)
GLUCOSE-CAPILLARY: 153 mg/dL — AB (ref 65–99)
GLUCOSE-CAPILLARY: 162 mg/dL — AB (ref 65–99)
GLUCOSE-CAPILLARY: 89 mg/dL (ref 65–99)
Glucose-Capillary: 101 mg/dL — ABNORMAL HIGH (ref 65–99)
Glucose-Capillary: 119 mg/dL — ABNORMAL HIGH (ref 65–99)

## 2017-02-05 LAB — TROPONIN I
TROPONIN I: 0.04 ng/mL — AB (ref <0.03)
TROPONIN I: 0.04 ng/mL — AB (ref <0.03)
Troponin I: 0.03 ng/mL (ref <0.03)

## 2017-02-05 LAB — VAS US CAROTID
LEFT ECA DIAS: -13 cm/s
LEFT VERTEBRAL DIAS: 59 cm/s
Left CCA dist dias: -22 cm/s
Left CCA dist sys: -98 cm/s
Left CCA prox dias: -16 cm/s
Left CCA prox sys: -97 cm/s
Left ICA prox dias: -56 cm/s
Left ICA prox sys: -159 cm/s
RIGHT ECA DIAS: 15 cm/s
RIGHT VERTEBRAL DIAS: 28 cm/s
Right CCA prox dias: -12 cm/s
Right CCA prox sys: -128 cm/s
Right cca dist sys: 135 cm/s

## 2017-02-05 MED ORDER — MORPHINE SULFATE (PF) 2 MG/ML IV SOLN: 1.0000 mg | INTRAVENOUS | Status: DC | PRN

## 2017-02-05 MED ORDER — ASPIRIN 81 MG PO CHEW
324.0000 mg | CHEWABLE_TABLET | Freq: Once | ORAL | Status: AC
  Administered 2017-02-05: 324 mg via ORAL
  Filled 2017-02-05: qty 4

## 2017-02-05 MED ORDER — DILTIAZEM HCL 25 MG/5ML IV SOLN
10.0000 mg | Freq: Once | INTRAVENOUS | Status: AC
  Administered 2017-02-05: 10 mg via INTRAVENOUS
  Filled 2017-02-05: qty 5

## 2017-02-05 MED ORDER — METOPROLOL TARTRATE 5 MG/5ML IV SOLN
5.0000 mg | INTRAVENOUS | Status: DC | PRN
  Administered 2017-02-05 (×2): 5 mg via INTRAVENOUS
  Filled 2017-02-05 (×2): qty 5

## 2017-02-05 MED ORDER — CARVEDILOL 12.5 MG PO TABS
12.5000 mg | ORAL_TABLET | Freq: Two times a day (BID) | ORAL | Status: DC
  Administered 2017-02-05 – 2017-02-06 (×2): 12.5 mg via ORAL
  Filled 2017-02-05 (×2): qty 1

## 2017-02-05 MED ORDER — AMIODARONE HCL 200 MG PO TABS
200.0000 mg | ORAL_TABLET | Freq: Two times a day (BID) | ORAL | Status: DC
  Administered 2017-02-05 – 2017-02-06 (×2): 200 mg via ORAL
  Filled 2017-02-05 (×2): qty 1

## 2017-02-05 MED ORDER — FUROSEMIDE 20 MG PO TABS
20.0000 mg | ORAL_TABLET | Freq: Two times a day (BID) | ORAL | Status: DC
  Administered 2017-02-06: 20 mg via ORAL
  Filled 2017-02-05: qty 1

## 2017-02-05 MED ORDER — AMIODARONE HCL 200 MG PO TABS: 200.0000 mg | ORAL_TABLET | Freq: Every day | ORAL | Status: DC

## 2017-02-05 MED ORDER — POTASSIUM CHLORIDE CRYS ER 20 MEQ PO TBCR
40.0000 meq | EXTENDED_RELEASE_TABLET | Freq: Once | ORAL | Status: AC
  Administered 2017-02-05: 40 meq via ORAL
  Filled 2017-02-05: qty 2

## 2017-02-05 MED ORDER — SODIUM CHLORIDE 0.9 % IV BOLUS (SEPSIS)
500.0000 mL | Freq: Once | INTRAVENOUS | Status: AC
  Administered 2017-02-05: 500 mL via INTRAVENOUS

## 2017-02-05 MED ORDER — FUROSEMIDE 10 MG/ML IJ SOLN
40.0000 mg | Freq: Once | INTRAMUSCULAR | Status: AC
  Administered 2017-02-05: 40 mg via INTRAVENOUS
  Filled 2017-02-05: qty 4

## 2017-02-05 MED ORDER — ISOSORBIDE MONONITRATE ER 60 MG PO TB24
60.0000 mg | ORAL_TABLET | Freq: Every day | ORAL | Status: DC
  Administered 2017-02-05 – 2017-02-06 (×2): 60 mg via ORAL
  Filled 2017-02-05 (×2): qty 1

## 2017-02-05 NOTE — Progress Notes (Signed)
  Speech Language Pathology  Patient Details Name: Heather Walters MRN: [id number] DOB: 1931-12-11 Today's Date: 02/05/2017 Time:  -     Received order to assess speech-language-cognition. Pt has history of mild dementia. Pt's daughter present who reported her speech and cognition is improving each day and is close to baseline. Full cognitive assessment not needed at present however recommend pt be [facility].             Royce MacadamiaLitaker, Willo Yoon Willis 02/05/2017, 3:10 PM  Breck CoonsLisa Willis Lonell FaceLitaker M.Ed [employer]SLP Pager [phone]

## 2017-02-05 NOTE — NC FL2 (Signed)
[place] MEDICAID FL2 LEVEL OF CARE SCREENING TOOL     IDENTIFICATION  Patient Name: Heather Walters Birthdate: 12-06-1931 Sex: female Admission Date (Current Location): 02/02/2017  [facility] and [place] Number:  Producer, television/film/video and Address:  [facility], [address] [place], [place] [address]      Provider Number: [id number]  Attending Physician Name and Address:  Cleora Fleet, MD  Relative Name and Phone Number:  Eber Jones [phone]    Current Level of Care: Hospital Recommended Level of Care: Skilled Nursing Facility Prior Approval Number:    Date Approved/Denied:   PASRR Number: [id number]  Discharge Plan: SNF    Current Diagnoses: Patient Active Problem List   Diagnosis Date Noted  . Hypoglycemia 02/02/2017  . Acute metabolic encephalopathy 02/02/2017  . A-fib (HCC) 02/02/2017  . Chronic diastolic CHF (congestive heart failure) (HCC) 02/02/2017  . Dehydration 02/02/2017  . CVA (cerebral vascular accident) (HCC) 02/02/2017  . AKI (acute kidney injury) (HCC) 02/02/2017  . Acute on chronic diastolic CHF (congestive heart failure) (HCC) 08/30/2016  . Hematochezia 08/30/2016  . FUO (fever of unknown origin) 08/30/2016  . Generalized weakness   . Bleeding hemorrhoids   . S/P total knee arthroplasty 07/29/2014  . Post-operative infection 07/29/2014  . Hypertension   . Depression   . Neuropathy   . GERD (gastroesophageal reflux disease)   . Hyperlipidemia   . Vitamin D deficiency     Orientation RESPIRATION BLADDER Height & Weight     Self, Time, Situation, Place  O2 (Nasal cannula 3L) Continent Weight: 86.2 kg (190 lb) Height:  5\' 1"  (154.9 cm)  BEHAVIORAL SYMPTOMS/MOOD NEUROLOGICAL BOWEL NUTRITION STATUS      Continent Diet (Please see DC Summary)  AMBULATORY STATUS COMMUNICATION OF NEEDS Skin   Limited Assist Verbally Normal                       Personal Care Assistance Level of Assistance  Bathing, Feeding,  Dressing Bathing Assistance: Maximum assistance Feeding assistance: Independent Dressing Assistance: Limited assistance     Functional Limitations Info             SPECIAL CARE FACTORS FREQUENCY  PT (By licensed PT)     PT Frequency: 5x/week              Contractures      Additional Factors Info  Code Status, Allergies, Isolation Precautions, Psychotropic Code Status Info: DNR Allergies Info: Codeine, Lipitor Atorvastatin Psychotropic Info: Trazadone   Isolation Precautions Info: VRE     Current Medications (02/05/2017):  This is the current hospital active medication list Current Facility-Administered Medications  Medication Dose Route Frequency Provider Last Rate Last Dose  . acetaminophen (TYLENOL) tablet 650 mg  650 mg Oral Q4H PRN Therisa Doyne, MD   650 mg at 02/04/17 2246   Or  . acetaminophen (TYLENOL) solution 650 mg  650 mg Per Tube Q4H PRN Therisa Doyne, MD       Or  . acetaminophen (TYLENOL) suppository 650 mg  650 mg Rectal Q4H PRN Doutova, Anastassia, MD      . albuterol (PROVENTIL) (2.5 MG/3ML) 0.083% nebulizer solution 2.5 mg  2.5 mg Nebulization Q6H PRN Doutova, Anastassia, MD      . ALPRAZolam Prudy Feeler) tablet 0.25 mg  0.25 mg Oral TID PRN Johnson, Clanford L, MD   0.25 mg at 02/05/17 1003  . amiodarone (PACERONE) tablet 200 mg  200 mg Oral Daily Johnson, Clanford  L, MD   200 mg at 02/05/17 0951  . amLODipine (NORVASC) tablet 10 mg  10 mg Oral Daily Johnson, Clanford L, MD   10 mg at 02/05/17 0951  . aspirin EC tablet 81 mg  81 mg Oral Daily Johnson, Clanford L, MD   81 mg at 02/05/17 0950  . carvedilol (COREG) tablet 12.5 mg  12.5 mg Oral BID WC Johnson, Clanford L, MD      . ferrous sulfate tablet 325 mg  325 mg Oral Q breakfast Johnson, Clanford L, MD   325 mg at 02/05/17 0950  . furosemide (LASIX) tablet 20 mg  20 mg Oral BID Laural BenesJohnson, Clanford L, MD   20 mg at 02/05/17 0951  . isosorbide mononitrate (IMDUR) 24 hr tablet 60 mg  60 mg Oral  Daily Johnson, Clanford L, MD   60 mg at 02/05/17 0950  . metoprolol tartrate (LOPRESSOR) injection 5 mg  5 mg Intravenous Q5 min PRN Opyd, Lavone Neriimothy S, MD   5 mg at 02/05/17 0251  . morphine 2 MG/ML injection 1 mg  1 mg Intravenous Q3H PRN Opyd, Lavone Neriimothy S, MD      . multivitamin with minerals tablet 1 tablet  1 tablet Oral Daily Laural BenesJohnson, Clanford L, MD   1 tablet at 02/05/17 0950  . nitroGLYCERIN (NITROSTAT) SL tablet 0.4 mg  0.4 mg Sublingual Q5 min PRN Johnson, Clanford L, MD      . ondansetron (ZOFRAN) tablet 4 mg  4 mg Oral Q8H PRN Johnson, Clanford L, MD   4 mg at 02/05/17 0245  . pantoprazole (PROTONIX) EC tablet 40 mg  40 mg Oral Daily Johnson, Clanford L, MD   40 mg at 02/05/17 0950  . rOPINIRole (REQUIP) tablet 0.5 mg  0.5 mg Oral QHS Johnson, Clanford L, MD   0.5 mg at 02/04/17 2246  . rosuvastatin (CRESTOR) tablet 40 mg  40 mg Oral Daily Johnson, Clanford L, MD   40 mg at 02/05/17 0950  . traZODone (DESYREL) tablet 75 mg  75 mg Oral QHS Johnson, Clanford L, MD   75 mg at 02/04/17 2245     Discharge Medications: Please see discharge summary for a list of discharge medications.  Relevant Imaging Results:  Relevant Lab Results:   Additional Information SSN: [id number]  Wrenn Willcox S DeRidderRayyan, [place]

## 2017-02-05 NOTE — Progress Notes (Signed)
  OT Cancellation Note  Patient Details Name: Heather Walters MRN: [id number] DOB: Oct 18, 1931   Cancelled Treatment:    Reason Eval/Treat Not Completed: Patient declined, no reason specified. Returned for second attempt for OT evaluation this date with pt declining due to friend visiting. Educated pt on benefits of participation with ADL and functional mobility for recovery and pt continued to politely but adamantly decline. Will check back as able for OT evaluation.   Doristine Sectionharity A Delois Tolbert, MS OTR/L  Pager: [phone]   Lantz Hermann A Shron Ozer 02/05/2017, 3:05 PM

## 2017-02-05 NOTE — Consult Note (Addendum)
Cardiology Consult    Patient ID: Heather Walters MRN: [id number], DOB/AGE: 1931/09/23   Admit date: 02/02/2017 Date of Consult: 02/05/2017  Primary Physician: Margaree Mackintosh, MD Primary Cardiologist: new - Dr. Mayford Knife Requesting Provider: Dr. Laural Benes   Reason for Consult: new onset atrial fibrillation  Patient Profile    Heather Walters has a PMH significant for HTN, HLD, mild aortic stenosis, chronic diastolic heart failure, hx of stroke, depression and anxiety. She was brought to [facility] via EMS with altered mental status.  Heather Walters is a 83 y.o. female who is being seen today for the evaluation of atrial fibrillation at the request of Dr. Laural Benes.   Past Medical History   Past Medical History:  Diagnosis Date  . Anxiety   . Arthritis   . Coronary artery disease    NSTEMI 12/2016 with 50% mid LAD and 30% OM1 by cath at [facility]  . Depression   . Fibromyalgia   . GERD (gastroesophageal reflux disease)   . Hyperlipidemia   . Hypertension   . Intervertebral disk disease    lumbar  . Mild aortic stenosis   . Neuropathy   . Stroke (HCC)   . Vitamin D deficiency     Past Surgical History:  Procedure Laterality Date  . ABDOMINAL HYSTERECTOMY    . CARDIAC CATHETERIZATION    . CHOLECYSTECTOMY    . COLONOSCOPY    . ESOPHAGOGASTRODUODENOSCOPY ENDOSCOPY    . JOINT REPLACEMENT     right total knee     Allergies  Allergies  Allergen Reactions  . Codeine Other (See Comments)    Unknown reaction  . Lipitor [Atorvastatin] Other (See Comments)    Caused cramping in her legs    History of Present Illness    [name] was recently hospitalized at [facility] for nausea. She was found to have multiple colon polyps that were biopsied. She also had mild confusion and afib RVR placed on amiodarone and eliquis. Provider at [facility] recommended holding eliquis for 2 weeks following polyp biopsies. She wad discharged back to her SNF [facility]), but suffered worsening mental  status and swelling. Per the family, she was sleepy and mildly confused.  Family brought her to [facility] for further evaluation.   Upon arrival, her BG was 9 (family states she was mistakenly given insulin at SNF)  and she had an abnormal ABG (respiratory acidosis). She was placed on BIPAP. Neurology was consulted and is completing a stroke workup. Head CT with left cerebellar hypodensity compatible with infarct (acute to subacute). MRA normal. Yesterday, mental status improved, neurology signed off, and she was taken off BIPAP. Cardiology was consulted for increased bouts of heart rate overnight.   Inpatient Medications    . amiodarone  200 mg Oral Daily  . amLODipine  10 mg Oral Daily  . aspirin EC  81 mg Oral Daily  . carvedilol  12.5 mg Oral BID WC  . ferrous sulfate  325 mg Oral Q breakfast  . [START ON 02/06/2017] furosemide  20 mg Oral BID  . multivitamin with minerals  1 tablet Oral Daily  . pantoprazole  40 mg Oral Daily  . rOPINIRole  0.5 mg Oral QHS  . rosuvastatin  40 mg Oral Daily  . traZODone  75 mg Oral QHS     Outpatient Medications    Prior to Admission medications   Medication Sig Start Date End Date Taking? Authorizing Provider  acetaminophen (TYLENOL) 500 MG tablet Take 1,000 mg by mouth  every 6 (six) hours as needed for fever.   Yes [provider]  albuterol (PROVENTIL HFA;VENTOLIN HFA) 108 (90 Base) MCG/ACT inhaler Inhale 2 puffs into the lungs every 6 (six) hours as needed for wheezing or shortness of breath.   Yes [provider]  albuterol (PROVENTIL) (2.5 MG/3ML) 0.083% nebulizer solution Take 2.5 mg by nebulization every 6 (six) hours as needed for wheezing or shortness of breath.   Yes [provider]  ALPRAZolam (XANAX) 0.25 MG tablet Take 0.25 mg by mouth 3 (three) times daily as needed for anxiety.   Yes [provider]  amiodarone (PACERONE) 200 MG tablet Take 400 mg by mouth 2 (two) times daily.   Yes [provider]  amLODipine (NORVASC) 10 MG tablet Take 10 mg by mouth daily.   Yes [provider]  apixaban (ELIQUIS) 5 MG TABS tablet Take 5 mg by mouth 2 (two) times daily.   Yes [provider]  aspirin EC 81 MG tablet Take 81 mg by mouth daily.   Yes [provider]  BIOTIN PO Take 2,500 mg by mouth daily.   Yes [provider]  carvedilol (COREG) 6.25 MG tablet Take 6.25 mg by mouth 2 (two) times daily with a meal.   Yes [provider]  ferrous sulfate (FEOSOL) 325 (65 FE) MG tablet Take 325 mg by mouth daily with breakfast.   Yes [provider]  furosemide (LASIX) 40 MG tablet Take 20 mg by mouth 2 (two) times daily.    Yes [provider]  Glucosamine-Chondroit-Vit C-Mn (GLUCOSAMINE 1500 COMPLEX PO) Take 1 tablet by mouth 2 (two) times daily.   Yes [provider]  hydrALAZINE (APRESOLINE) 100 MG tablet Take 100 mg by mouth 3 (three) times daily.   Yes [provider]  isosorbide mononitrate (IMDUR) 60 MG 24 hr tablet Take 60 mg by mouth daily.   Yes [provider]  Multiple Vitamin (MULTIVITAMIN WITH MINERALS) TABS tablet Take 1 tablet by mouth daily.   Yes [provider]  nitroGLYCERIN (NITROSTAT) 0.4 MG SL tablet Place 0.4 mg under the tongue every 5 (five) minutes as needed for chest pain.   Yes [provider]  omeprazole (PRILOSEC) 40 MG capsule Take 40 mg by mouth daily.   Yes [provider]  ondansetron (ZOFRAN) 4 MG tablet Take 4 mg by mouth every 8 (eight) hours as needed for nausea or vomiting.   Yes [provider]  promethazine (PHENERGAN) 25 MG tablet Take 25 mg by mouth every 6 (six) hours as needed for nausea or vomiting.   Yes [provider]  rOPINIRole (REQUIP) 0.5 MG tablet Take 0.5 mg by mouth at bedtime.   Yes [provider]  rosuvastatin (CRESTOR) 20 MG tablet Take 20 mg by mouth daily.   Yes [provider]  sucralfate  (CARAFATE) 1 G tablet Take 1 g by mouth daily as needed (ulcer pain).    Yes [provider]  traMADol (ULTRAM) 50 MG tablet Take 50 mg by mouth every 8 (eight) hours as needed for severe pain.    Yes [provider]  traZODone (DESYREL) 50 MG tablet Take 75 mg by mouth at bedtime.    Yes [provider]     Family History    Family History  Problem Relation Age of Onset  . Diabetes Mother   . CAD Mother   . Cancer Mother   . Diabetes Father  Social History    Social History   Social History  . Marital status: Married    Spouse name: N/A  . Number of children: N/A  . Years of education: N/A   Occupational History  . Not on file.   Social History Main Topics  . Smoking status: Never Smoker  . Smokeless tobacco: Never Used  . Alcohol use No  . Drug use: No  . Sexual activity: No   Other Topics Concern  . Not on file   Social History Narrative  . No narrative on file     Review of Systems    General:  No chills, fever, night sweats or weight changes.  Cardiovascular:  No chest pain, dyspnea on exertion, + edema, no orthopnea, + palpitations, no paroxysmal nocturnal dyspnea. Dermatological: No rash, lesions/masses Respiratory: No cough, dyspnea Urologic: No hematuria, dysuria Abdominal:   No nausea, vomiting, diarrhea, bright red blood per rectum, melena, or hematemesis Neurologic:  No visual changes, wkns, changes in mental status. All other systems reviewed and are otherwise negative except as noted above.  Physical Exam    Blood pressure (!) 137/44, pulse 65, temperature 97.9 F (36.6 C), resp. rate 18, height 5\' 1"  (1.549 m), weight 190 lb (86.2 kg), SpO2 100 %.  General: Pleasant, NAD Psych: Normal affect. Neuro: Alert and oriented X 3. Moves all extremities spontaneously. HEENT: Normal  Neck: Supple without bruits, minimal JVD. Lungs:  Resp regular and unlabored, CTA. Heart: RRR, 3/6 systolic murmur Abdomen: Soft,  non-tender, non-distended, BS + x 4.  Extremities: No clubbing, cyanosis, 1+ edema. DP/PT/Radials 2+ and equal bilaterally.  Labs    Troponin [facility] Test)  Recent Labs  02/02/17 2112  TROPIPOC 0.04    Recent Labs  02/03/17 1257 02/05/17 0152 02/05/17 0619 02/05/17 1258  TROPONINI 0.03* 0.04* 0.04* 0.03*   Lab Results  Component Value Date   WBC 10.8 (H) 02/04/2017   HGB 9.0 (L) 02/04/2017   HCT 29.4 (L) 02/04/2017   MCV 94.8 02/04/2017   PLT 299 02/04/2017     Recent Labs Lab 02/04/17 0140  NA 139  K 3.6  CL 101  CO2 30  BUN 8  CREATININE 1.18*  CALCIUM 9.6  PROT 4.8*  BILITOT 0.6  ALKPHOS 68  ALT 14  AST 18  GLUCOSE 105*   Lab Results  Component Value Date   CHOL 181 02/03/2017   HDL 27 (L) 02/03/2017   LDLCALC 121 (H) 02/03/2017   TRIG 165 (H) 02/03/2017   No results found for: [facility]   Radiology Studies    Dg Chest 2 View  Result Date: 02/02/2017 CLINICAL DATA:  Altered mental status EXAM: CHEST  2 VIEW COMPARISON:  None. FINDINGS: Shallow lung inflation. Bilateral opacities suggesting pulmonary edema. No pneumothorax. Small left pleural effusion. Mild cardiomegaly. IMPRESSION: Shallow lung inflation and mild pulmonary edema. Electronically Signed   By: Deatra Robinson M.D.   On: 02/02/2017 21:30   Ct Head Wo Contrast  Result Date: 02/02/2017 CLINICAL DATA:  83 year old female with altered mental status. EXAM: CT HEAD WITHOUT CONTRAST TECHNIQUE: Contiguous axial images were obtained from the base of the skull through the vertex without intravenous contrast. COMPARISON:  None. FINDINGS: Brain: Hypodensity within the left cerebellum likely represents an acute to subacute infarct. There is no evidence of hemorrhage, midline shift or hydrocephalus. Mild chronic small-vessel white matter ischemic changes are noted. Vascular: Intracranial atherosclerotic calcifications noted. Skull: Normal. Negative for fracture or focal lesion. Sinuses/Orbits: No  acute finding. Other: None. IMPRESSION: Left cerebellar hypodensity compatible with infarct-likely acute to subacute. No evidence of hemorrhage. Mild chronic small-vessel white matter ischemic changes. Electronically Signed   By: Harmon Pier M.D.   On: 02/02/2017 21:27   Mr Maxine Glenn Head Wo Contrast  Result Date: 02/03/2017 CLINICAL DATA:  Initial evaluation for worsening mental status. EXAM: MRI HEAD WITHOUT CONTRAST MRA HEAD WITHOUT CONTRAST TECHNIQUE: Multiplanar, multiecho pulse sequences of the brain and surrounding structures were obtained without intravenous contrast. Angiographic images of the head were obtained using MRA technique without contrast. COMPARISON:  Prior CT from 02/02/2017. FINDINGS: MRI HEAD FINDINGS Brain: Study limited as the patient was unable to tolerate the full length of the exam. Generalized age appropriate cerebral atrophy. T2/FLAIR hyperintensity within the periventricular and deep white matter both cerebral hemispheres most likely related to chronic small vessel ischemic disease, mild for age. Chronic microvascular changes present within the pons. Patchy T2/FLAIR signal abnormality seen within the peripheral left cerebellar hemisphere noted (Series 11, image 6), most likely related to prior ischemic infarct. This corresponds with previously noted hypodensity on prior CT. No abnormal foci of restricted diffusion to suggest acute or subacute ischemia. Gray-white matter differentiation otherwise maintained. No other areas of encephalomalacia to suggest chronic infarction. No evidence for acute or chronic intracranial hemorrhage. No mass lesion, midline shift or mass effect. Ventricles normal size without evidence for hydrocephalus. No extra-axial fluid collection. Major dural sinuses are grossly patent. Midline structures intact and normal. Pituitary gland grossly unremarkable, although not well evaluated on this motion degraded exam. Vascular: Major intracranial vascular flow voids are  maintained. Skull and upper cervical spine: Craniocervical junction within normal limits. Visualized upper cervical spine unremarkable. Bone marrow signal intensity normal. No scalp soft tissue abnormality. Sinuses/Orbits: Globes and orbital soft tissues within normal limits. Mild scattered mucosal thickening within the paranasal sinuses. No air-fluid level to suggest active sinus infection. Left mastoid effusion noted. Nasopharynx grossly clear. Inner ear structures grossly unremarkable. MRA HEAD FINDINGS ANTERIOR CIRCULATION: Study moderately degraded by motion artifact. Distal cervical segments of the internal carotid arteries are patent with antegrade flow. Petrous, cavernous, and supraclinoid segments widely patent without flow-limiting stenosis. Ophthalmic arteries patent bilaterally. ICA termini widely patent. A1 segments well opacified bilaterally. Anterior cerebral artery is widely patent to their distal aspects. M1 segments patent without stenosis or occlusion. No proximal M2 occlusion. Distal MCA branches well opacified and symmetric. Mild small vessel atheromatous irregularity. POSTERIOR CIRCULATION: Vertebral arteries codominant and widely patent to the vertebrobasilar junction. Posterior inferior cerebral arteries patent proximally. Basilar artery widely patent to its distal aspect. Superior cerebral arteries patent bilaterally. Both of the posterior cerebral artery is supplied primarily via the basilar and are widely patent to their distal aspects. Mild atheromatous irregularity within the PCAs bilaterally without high-grade stenosis. Small bilateral posterior communicating arteries noted No aneurysm or vascular malformation. IMPRESSION: MRI HEAD IMPRESSION: 1. No acute intracranial infarct or other process identified. 2. Remote left cerebellar infarct. This accounts for previously noted hypodensity seen on prior CT. 3. Mild for age chronic small vessel ischemic disease. 4. Left mastoid effusion. MRA  HEAD IMPRESSION: 1. Normal MRA appearance of the large and medium sized vessels of the intracranial circulation. No large vessel occlusion. No high-grade or correctable stenosis. 2. Mild distal small vessel atheromatous irregularity. Electronically Signed   By: Rise Mu M.D.   On: 02/03/2017 03:24   Mr Brain Wo Contrast  Result Date: 02/03/2017 CLINICAL DATA:  Initial evaluation for worsening mental status. EXAM:  MRI HEAD WITHOUT CONTRAST MRA HEAD WITHOUT CONTRAST TECHNIQUE: Multiplanar, multiecho pulse sequences of the brain and surrounding structures were obtained without intravenous contrast. Angiographic images of the head were obtained using MRA technique without contrast. COMPARISON:  Prior CT from 02/02/2017. FINDINGS: MRI HEAD FINDINGS Brain: Study limited as the patient was unable to tolerate the full length of the exam. Generalized age appropriate cerebral atrophy. T2/FLAIR hyperintensity within the periventricular and deep white matter both cerebral hemispheres most likely related to chronic small vessel ischemic disease, mild for age. Chronic microvascular changes present within the pons. Patchy T2/FLAIR signal abnormality seen within the peripheral left cerebellar hemisphere noted (Series 11, image 6), most likely related to prior ischemic infarct. This corresponds with previously noted hypodensity on prior CT. No abnormal foci of restricted diffusion to suggest acute or subacute ischemia. Gray-white matter differentiation otherwise maintained. No other areas of encephalomalacia to suggest chronic infarction. No evidence for acute or chronic intracranial hemorrhage. No mass lesion, midline shift or mass effect. Ventricles normal size without evidence for hydrocephalus. No extra-axial fluid collection. Major dural sinuses are grossly patent. Midline structures intact and normal. Pituitary gland grossly unremarkable, although not well evaluated on this motion degraded exam. Vascular: Major  intracranial vascular flow voids are maintained. Skull and upper cervical spine: Craniocervical junction within normal limits. Visualized upper cervical spine unremarkable. Bone marrow signal intensity normal. No scalp soft tissue abnormality. Sinuses/Orbits: Globes and orbital soft tissues within normal limits. Mild scattered mucosal thickening within the paranasal sinuses. No air-fluid level to suggest active sinus infection. Left mastoid effusion noted. Nasopharynx grossly clear. Inner ear structures grossly unremarkable. MRA HEAD FINDINGS ANTERIOR CIRCULATION: Study moderately degraded by motion artifact. Distal cervical segments of the internal carotid arteries are patent with antegrade flow. Petrous, cavernous, and supraclinoid segments widely patent without flow-limiting stenosis. Ophthalmic arteries patent bilaterally. ICA termini widely patent. A1 segments well opacified bilaterally. Anterior cerebral artery is widely patent to their distal aspects. M1 segments patent without stenosis or occlusion. No proximal M2 occlusion. Distal MCA branches well opacified and symmetric. Mild small vessel atheromatous irregularity. POSTERIOR CIRCULATION: Vertebral arteries codominant and widely patent to the vertebrobasilar junction. Posterior inferior cerebral arteries patent proximally. Basilar artery widely patent to its distal aspect. Superior cerebral arteries patent bilaterally. Both of the posterior cerebral artery is supplied primarily via the basilar and are widely patent to their distal aspects. Mild atheromatous irregularity within the PCAs bilaterally without high-grade stenosis. Small bilateral posterior communicating arteries noted No aneurysm or vascular malformation. IMPRESSION: MRI HEAD IMPRESSION: 1. No acute intracranial infarct or other process identified. 2. Remote left cerebellar infarct. This accounts for previously noted hypodensity seen on prior CT. 3. Mild for age chronic small vessel ischemic  disease. 4. Left mastoid effusion. MRA HEAD IMPRESSION: 1. Normal MRA appearance of the large and medium sized vessels of the intracranial circulation. No large vessel occlusion. No high-grade or correctable stenosis. 2. Mild distal small vessel atheromatous irregularity. Electronically Signed   By: Rise Mu M.D.   On: 02/03/2017 03:24   Dg Chest Port 1 View  Result Date: 02/05/2017 CLINICAL DATA:  Chest pain EXAM: PORTABLE CHEST 1 VIEW COMPARISON:  February 04, 2017 FINDINGS: There is persistent interstitial edema with minimal right pleural effusion. There is no airspace consolidation. There is cardiomegaly with pulmonary venous hypertension. No adenopathy. No bone lesions evident. IMPRESSION: Evidence of congestive heart failure. No airspace consolidation. Stable cardiac silhouette. Electronically Signed   By: Bretta Bang III M.D.   On:  02/05/2017 07:36   Dg Chest Port 1 View  Result Date: 02/04/2017 CLINICAL DATA:  Congestive heart failure. EXAM: PORTABLE CHEST 1 VIEW COMPARISON:  02/02/2017 radiographs FINDINGS: Cardiomegaly and bilateral airspace opacities and trace bilateral pleural effusions again noted. There is no evidence of pneumothorax. There has been little interval change since the prior study. IMPRESSION: Unchanged appearance of the chest with bilateral airspace opacities and trace bilateral pleural effusions. Electronically Signed   By: Harmon PierJeffrey  Hu M.D.   On: 02/04/2017 07:47    ECG & Cardiac Imaging    EKG 02/05/17: sinus bradycardia, poor R wave progression, cannot rule out anterior infarct  EKG 02/05/17: atrial fibrillation with ventricular rate 132 bpm  Echocardiogram 02/04/17: Study Conclusions - Left ventricle: The cavity size was normal. Wall thickness was   increased in a pattern of mild LVH. Systolic function was normal.   The estimated ejection fraction was in the range of 60% to 65%.   Left ventricular diastolic function parameters were normal. - Aortic  valve: There was mild stenosis. Valve area (VTI): 1.81   cm^2. Valve area (Vmax): 1.52 cm^2. Valve area (Vmean): 1.41   cm^2. - Left atrium: The atrium was mildly dilated. - Atrial septum: No defect or patent foramen ovale was identified. - Pulmonary arteries: PA peak pressure: 39 mm Hg (S).    Assessment & Plan    1. New onset atrial fibrillation - per Care Everywhere, she had Afib RVR at [facility] during her hospitalization there this month. She converted on a diltiazem drip to NSR and was transitioned to PO amiodarone when she passed her swallow evaluation. She started 400 mg BID amiodarone on 01/25/17 and then transitioned to 200 mg daily.  GI recommended holding eliquis for 2 weeks following her polyp biopsy, to be resumed on 02/15/17.  - Amiodarone 200 mg daily has been resumed during this admission. She is currently in NSR. Per telemetry review, she had bouts of Afib RVR in the 140s overnight, prompting a cardiology consult today. If she has another bout of RVR, would recommend amiodarone bolus - Recommend restarting eliquis when safe to do so, will defer this decision to Medicine service.   2. Elevated troponin - 0.04 --> 0.04 --> 0.03 - mildly elevated and flat trend, not in a pattern that would be consistent for ischemia - recent cath last month with nonobstructive CAD. - likely elevated in the setting of recent PNA, confusion, and RVR   3.  ASCAD - nonobstructive by recent cath with 50% mid LAD and 30% OM - continue ASA, statin, BB and long acting nitrates   4. HTN - continue norvasc, coreg, and imdur - home hydralazine on hold - BP has been stable and recommended to run high, per neurology   5. HLD - continue statin   6. Mild aortic stenosis - stable   7. AKI - sCr 1.18 (1.35) - baseline sCr 0.75-0.93   8. Delirium / encephalopathy - patient is alert and oriented on my interview, answers questions appropriately - she is anxious about being  hospitalized   9. Edema - echocardiogram without signs of congestive heart failure - BNP not collected on admission - albumin is 2.2; she is likely third-spacing fluid. Will hold off on lasix for now   Patient seen and independently examined with Micah FlesherAngela Duke, PA. We discussed all aspects of the encounter. I agree with the assessment and plan as stated above with some modifications.  Patient with no prior history of arrhythmia or  CAD but history of mild AS, HTN, nonobstructive CAD in setting of NSTEMI 12/2016 with cath showing 50% mid LAD and 30% OM1 o/w normal coronary arteries and ? Chronic diastolic CHF when she presented with Nausea and constipation and went into afib with RVR. She was placed on IV Cardizem gtt and transitioned to Amio PO.  She was started on Eliqis but found to have multiple colon polyps on CT and Eliquis was held and colonoscopy was done with multiple bx (12).  She was instructed to hold Eliquis for 2 weeks post bx.  She then went to [facility] and had worsening MS and anasarca and was transferred to [facility].  She was mistakenly given Insulin and BS was 32 and she had a respiratory acidosis. MRA of head was normal. She was noted to have several episodes of PAF with RVR overnight and Cardiology is asked to consult.  She is now back in NSR.  She is asymptomatic.  On exam her lungs are CTA and heart RRR with 2/6 SM at RUSB.  She has mild edema likely secondary to hyopalbuminemia.  2D echo showed normal LVF with normal diastolic function and mild AS.  Continue imdur, ASA and statin for nonobstructive CAD.  Increase carvedilol to 12.5mg  BID and continue but increase Amio to 200mg  BID for 1 week and then 200mg  daily.  Her Amio was started at 400mg  BID for only 1 week and then was put on 200mg  daily.Marland Kitchen  Restart Eliquis when ok by GI.   Signed, Armanda Magic, PA-C 02/05/2017, 3:50 PM [phone]

## 2017-02-05 NOTE — Progress Notes (Addendum)
Physical Therapy Treatment Patient Details Name: Heather Walters MRN: [id number] DOB: 25-Sep-1931 Today's Date: 02/05/2017    History of Present Illness Heather Walters is a 83 y.o. female with history of previous stroke, dementia, neuropathy, aortic stenosis, hypertension, hyperlipidemia, fibromyalgia, anxiety and depression presenting with sleepiness and mild confusion; was hypoglycemic upon arrival to ED    PT Comments    Pt admitted with above diagnosis. Pt currently with functional limitations due to balance and endurance deficits. Pt was able to ambulate with mod to min assist with RW with chair follow and incr distance today.  Husband attentive but still unsure if pt at level that he can care for her at home. Pt was on 3L on arrival and incr to 4L for O2 sats to stay >90% with ambulation.  Pt used home O2 prior to admit.  Will continue PT.   Pt will benefit from skilled PT to increase their independence and safety with mobility to allow discharge to the venue listed below.     Follow Up Recommendations  SNF;Other (comment)- family doesn't want SNF but pt would benefit.      Equipment Recommendations  3in1 (PT)    Recommendations for Other Services       Precautions / Restrictions Precautions Precautions: Fall Restrictions Weight Bearing Restrictions: No    Mobility  Bed Mobility Overal bed mobility: Needs Assistance Bed Mobility: Supine to Sit     Supine to sit: Mod assist     General bed mobility comments: Mod handheld assist to pull to sit  Transfers Overall transfer level: Needs assistance Equipment used: Rolling walker (2 wheeled) Transfers: Sit to/from Stand Sit to Stand: Mod assist;Min assist         General transfer comment: Light mod to min assist to power up wtih incr time  and asssit to steady during transition of hands form bed to RW; cues for hand placement and safety  Ambulation/Gait Ambulation/Gait assistance: Mod assist;Min assist;+2  safety/equipment Ambulation Distance (Feet): 29 Feet (24 feet and then 5 feet) Assistive device: Rolling walker (2 wheeled) Gait Pattern/deviations: Step-through pattern;Decreased step length - right;Decreased step length - left;Trunk flexed;Wide base of support;Leaning posteriorly   Gait velocity interpretation: Below normal speed for age/gender General Gait Details: Very anxious with moving; cues and encouragement given and recliner pushed behind to boost confidence.  Pt was able to incr distance but did need chair follow and one seated rest break.     Stairs            Wheelchair Mobility    Modified Rankin (Stroke Patients Only)       Balance Overall balance assessment: Needs assistance Sitting-balance support: No upper extremity supported;Feet supported Sitting balance-Leahy Scale: Fair Sitting balance - Comments: able to sit on EOB without assist    Standing balance support: Bilateral upper extremity supported;During functional activity Standing balance-Leahy Scale: Poor Standing balance comment: relies on RW for balance                            Cognition Arousal/Alertness: Awake/alert Behavior During Therapy: WFL for tasks assessed/performed Overall Cognitive Status: Within Functional Limits for tasks assessed                                        Exercises General Exercises - Lower Extremity Ankle Circles/Pumps: AROM;Both;10 reps;Supine Heel Slides: AROM;Both;10 reps;Supine  General Comments General comments (skin integrity, edema, etc.): Removed the female external catheter and nurse and NT aware as pt was left in chair after walking.       Pertinent Vitals/Pain Pain Assessment: Faces Faces Pain Scale: Hurts little more Pain Location: bil feet with standing; pt also reports chest pain -- she attributes this to anxiety; RN notified Pain Descriptors / Indicators: Aching;Burning;Grimacing Pain Intervention(s): Limited  activity within patient's tolerance;Monitored during session;Premedicated before session;Repositioned    Home Living                      Prior Function            PT Goals (current goals can now be found in the care plan section) Progress towards PT goals: Progressing toward goals    Frequency    Min 2X/week      PT Plan Current plan remains appropriate    Co-evaluation              AM-PAC PT "6 Clicks" Daily Activity  Outcome Measure  Difficulty turning over in bed (including adjusting bedclothes, sheets and blankets)?: Total Difficulty moving from lying on back to sitting on the side of the bed? : Total Difficulty sitting down on and standing up from a chair with arms (e.g., wheelchair, bedside commode, etc,.)?: Total Help needed moving to and from a bed to chair (including a wheelchair)?: A Little Help needed walking in hospital room?: A Lot Help needed climbing 3-5 steps with a railing? : Total 6 Click Score: 9    End of Session Equipment Utilized During Treatment: Gait belt;Oxygen Activity Tolerance: Patient limited by fatigue Patient left: with call bell/phone within reach;in chair;with family/visitor present Nurse Communication: Mobility status PT Visit Diagnosis: Other abnormalities of gait and mobility (R26.89);Muscle weakness (generalized) (M62.81);Difficulty in walking, not elsewhere classified (R26.2)     Time: [id number] PT Time Calculation (min) (ACUTE ONLY): 26 min  Charges:  $Gait Training: 8-22 mins $Therapeutic Exercise: 8-22 mins                    G Codes:       Shakiera Edelson,PT Acute Rehabilitation [phone] (pager)    Berline Lopesawn F Dianna Ewald 02/05/2017, 11:21 AM

## 2017-02-05 NOTE — Clinical Social Work Note (Signed)
Clinical Social Work Assessment  Patient Details  Name: Heather Walters MRN: [id number] Date of Birth: 27-Apr-1932  Date of referral:  02/05/17               Reason for consult:  Facility Placement                Permission sought to share information with:  Facility Medical sales representativeContact Representative, Family Supports Permission granted to share information::  Yes, Verbal Permission Granted  Name::     MetallurgistLory  Agency::  SNFs  Relationship::  Daughter  Contact Information:  [phone]  Housing/Transportation Living arrangements for the past 2 months:  Skilled Nursing Facility Source of Information:  Patient, Adult Children, Spouse Patient Interpreter Needed:  None Criminal Activity/Legal Involvement Pertinent to Current Situation/Hospitalization:  No - Comment as needed Significant Relationships:  Adult Children, Spouse Lives with:  Spouse, Facility Resident Do you feel safe going back to the place where you live?  No Need for family participation in patient care:  Yes (Comment)  Care giving concerns:  CSW received consult for possible SNF placement at time of discharge. CSW spoke with patient, patient's spouse, and daughter regarding PT recommendation of SNF placement at time of discharge. [facility] [facility] [facility] Patient reported that patient's spouse is currently unable to care for patient at their home given patient's current physical needs and fall risk. Patient expressed understanding of PT recommendation and is agreeable to SNF placement at time of discharge. CSW to continue to follow and assist with discharge planning needs.   Social Worker assessment / plan:  CSW spoke with patient and her family concerning [facility].  Employment status:  Retired Database administratornsurance information:  Managed Medicare PT Recommendations:  Skilled Nursing Facility Information / Referral to community resources:   Skilled Nursing Facility  Patient/Family's Response to care:  Patient recognizes need for rehab before returning home and is agreeable to a SNF. [facility] CSW explained insurance authorization process.   Patient/Family's Understanding of and Emotional Response to Diagnosis, Current Treatment, and Prognosis:  Patient/family is realistic regarding therapy needs and expressed being hopeful for SNF placement. Patient expressed understanding of CSW role and discharge process as well as her medical condition. Patient was tearful at having to go farther away, but her family wants her to try a different facility than the one she was at.  No questions/concerns about plan or treatment.    Emotional Assessment Appearance:  Appears stated age Attitude/Demeanor/Rapport:  Other (Appropriate) Affect (typically observed):  Accepting, Appropriate Orientation:  Oriented to Self, Oriented to Situation, Oriented to Place, Oriented to  Time Alcohol / Substance use:  Not Applicable Psych involvement (Current and /or in the community):  No (Comment)  Discharge Needs  Concerns to be addressed:  Care Coordination Readmission within the last 30 days:  No Current discharge risk:  None Barriers to Discharge:  Continued Medical Work up   [employer] S Athalene Kolle, LCSWA 02/05/2017, 5:21 PM

## 2017-02-05 NOTE — Progress Notes (Signed)
At around 11:30pm, 02/04/17, pt was in Afib., with a low BP. EKG and Vital signs completed. MD was notified. He advised to give a Cardizem IV push with a 500 cc NS bolus. Cardiac telemetry was also ordered for the pt. Orders were completed.  Pt was still in Afib. In addition, pt was complaining of chest pain and SOB. Pt's BP had improved. MD was paged again. He advised to give 324 mg of Aspirin and give metoprolol IV PRN for pain. Orders were completed. The pt had some relief for a while. In a short while pt complained of nausea and a return of chest pain. Zofran and another dose of metoprolol were given and pt reported some relief. Few minutes after this dose, pt converted back to sinus rhythm/sinus brady. Pt was able to rest. Will continue to monitor.

## 2017-02-05 NOTE — Progress Notes (Signed)
OT Cancellation Note  Patient Details Name: Dionne Milothalee Casali MRN: [id number] DOB: 01/30/1932   Cancelled Treatment:    Reason Eval/Treat Not Completed: Patient at procedure or test/ unavailable. Procedure in progress in room. Will check back as able for OT evaluation.  Doristine Sectionharity A Breely Panik, MS OTR/L  Pager: [phone]  Doristine SectionCharity A Camry Robello 02/05/2017, 12:49 PM

## 2017-02-05 NOTE — Progress Notes (Signed)
PROGRESS NOTE    Heather Walters  ZOX:[id number]  DOB: 03-Feb-1932  DOA: 02/02/2017 PCP: Margaree Mackintosh, MD   Brief Admission Hx: Heather Walters is a 83 y.o. female with a history of dementia diagnosed last December who presents with worsening mental status over the past 24-48 hours. She was just discharged from [facility] yesterday after having been hospitalized there with nausea, found to have multiple polyps which were biopsied, A. fib with RVR. She was started on Eliquis, but then this was pulsed to biopsy multiple small colon polyps and she was recommended to be off Eliquis for 2 weeks.  Her family states that she was rather sleepy while in the hospital, and mildly confused. She has been much worse over the past 24 hours. Apparently, staff was not aware for presence there per previous notes.  On arrival, her blood glucose was 32.   MDM/Assessment & Plan:   Acute mental status changes - Resolved now, off bipap, mentating much better now.  Pt is DNR confirmed with husband. MRI with no acute CVA noted.   Neurology signed off.   Severe hypoglycemia - Unknown cause, not known to be on insulin, D10 infusion was used, BS stable now, will order diet this morning.  No further low BS noted.   Acute respiratory failure - respiratory acidosis resolved now, treated with continuous bipap and now on [place].     Chronic diastolic CHF - seems overloaded today.  follow I/O closely. Follow weights.  Resume home lasix and other cardiac meds.  Give IV lasix 40 mg x 1 AKI - gently hydrated. Follow Cr. It has improved.   OSA - CPAP nightly. Dementia - monitoring closely. Family at bedside.   RLS - stable. Resume home meds.   GERD - GI protection ordered.  Chest pain with Afib with RVR - recently started on amiodarone at [facility], now should be on 200 mg daily per hospital records from [facility].  on eliquis but on hold secondary to recent colon biopsies.    Follow Hg. Eliquis should be restarted on 02/15/17.   Given episode last night of Afib with RVR I have requested formal cardiology consult 6/11. Troponin has been flat.     DVT prophylaxis: SCDs Code Status: DNR Family Communication: daughter at bedside Disposition Plan: Hopefully to SNF tomorrow, Social worker trying to place   Consultants:  Neurology  Cardiology   Subjective: Pt much more alert, oriented but very weak, no more chest pain.  Objective: Vitals:   02/05/17 0251 02/05/17 0344 02/05/17 0948 02/05/17 1358  BP: 124/85 (!) 134/44 (!) 149/46 (!) 137/44  Pulse: (!) 144 70 85 65  Resp:  18  18  Temp:  98.5 F (36.9 C)  97.9 F (36.6 C)  TempSrc:      SpO2: 100% 100% 100% 100%  Weight:      Height:        Intake/Output Summary (Last 24 hours) at 02/05/17 1441 Last data filed at 02/05/17 1022  Gross per 24 hour  Intake              240 ml  Output              250 ml  Net              -10 ml   Filed Weights   02/02/17 2031  Weight: 86.2 kg (190 lb)     REVIEW OF SYSTEMS  As per history otherwise all reviewed and reported negative  Exam:  General exam: chronically ill appearing, pale. Awake,alert.    Respiratory system: shallow breathing.  Cardiovascular system: S1 & S2 heard.  Gastrointestinal system: Abdomen is nondistended, soft and nontender. Normal bowel sounds heard. Central nervous system: No focal neurological deficits. Extremities: diffuse edema.   Data Reviewed: Basic Metabolic Panel:  Recent Labs Lab 02/02/17 2135 02/04/17 0140  NA 137 139  K 4.0 3.6  CL 103 101  CO2 24 30  GLUCOSE 153* 105*  BUN 14 8  CREATININE 1.35* 1.18*  CALCIUM 9.4 9.6   Liver Function Tests:  Recent Labs Lab 02/02/17 2135 02/04/17 0140  AST 23 18  ALT 14 14  ALKPHOS 68 68  BILITOT 0.7 0.6  PROT 5.2* 4.8*  ALBUMIN 2.4* 2.2*   No results for input(s): LIPASE, AMYLASE in the last 168 hours.  Recent Labs Lab 02/02/17 2135  AMMONIA 27   CBC:  Recent Labs Lab 02/02/17 2135 02/04/17 0140    WBC 11.4* 10.8*  NEUTROABS  --  6.9  HGB 9.6* 9.0*  HCT 31.2* 29.4*  MCV 94.0 94.8  PLT 293 299   Cardiac Enzymes:  Recent Labs Lab 02/03/17 0609 02/03/17 1257 02/05/17 0152 02/05/17 0619 02/05/17 1258  TROPONINI 0.04* 0.03* 0.04* 0.04* 0.03*   CBG (last 3)   Recent Labs  02/05/17 0344 02/05/17 0747 02/05/17 1207  GLUCAP 101* 89 119*   No results found for this or any previous visit (from the past 240 hour(s)).   Studies: Dg Chest Port 1 View  Result Date: 02/05/2017 CLINICAL DATA:  Chest pain EXAM: PORTABLE CHEST 1 VIEW COMPARISON:  February 04, 2017 FINDINGS: There is persistent interstitial edema with minimal right pleural effusion. There is no airspace consolidation. There is cardiomegaly with pulmonary venous hypertension. No adenopathy. No bone lesions evident. IMPRESSION: Evidence of congestive heart failure. No airspace consolidation. Stable cardiac silhouette. Electronically Signed   By: Bretta Bang III M.D.   On: 02/05/2017 07:36   Dg Chest Port 1 View  Result Date: 02/04/2017 CLINICAL DATA:  Congestive heart failure. EXAM: PORTABLE CHEST 1 VIEW COMPARISON:  02/02/2017 radiographs FINDINGS: Cardiomegaly and bilateral airspace opacities and trace bilateral pleural effusions again noted. There is no evidence of pneumothorax. There has been little interval change since the prior study. IMPRESSION: Unchanged appearance of the chest with bilateral airspace opacities and trace bilateral pleural effusions. Electronically Signed   By: Harmon Pier M.D.   On: 02/04/2017 07:47   Scheduled Meds: . amiodarone  200 mg Oral Daily  . amLODipine  10 mg Oral Daily  . aspirin EC  81 mg Oral Daily  . carvedilol  12.5 mg Oral BID WC  . ferrous sulfate  325 mg Oral Q breakfast  . [START ON 02/06/2017] furosemide  20 mg Oral BID  . isosorbide mononitrate  60 mg Oral Daily  . multivitamin with minerals  1 tablet Oral Daily  . pantoprazole  40 mg Oral Daily  . rOPINIRole  0.5 mg  Oral QHS  . rosuvastatin  40 mg Oral Daily  . traZODone  75 mg Oral QHS   Continuous Infusions:  Active Problems:   Hypertension   GERD (gastroesophageal reflux disease)   Hypoglycemia   Acute metabolic encephalopathy   A-fib (HCC)   Chronic diastolic CHF (congestive heart failure) (HCC)   Dehydration   CVA (cerebral vascular accident) (HCC)   AKI (acute kidney injury) (HCC)   Time spent: 30  mins  Standley Dakins, MD, FAAFP [facility] Pager [phone]  [phone]  If 7PM-7AM, please contact night-coverage [email or web address] Password [email or web address] 02/05/2017, 2:41 PM    LOS: 3 days

## 2017-02-06 DIAGNOSIS — N179 Acute kidney failure, unspecified: Secondary | ICD-10-CM

## 2017-02-06 DIAGNOSIS — I2510 Atherosclerotic heart disease of native coronary artery without angina pectoris: Secondary | ICD-10-CM

## 2017-02-06 LAB — COMPREHENSIVE METABOLIC PANEL
ALBUMIN: 2 g/dL — AB (ref 3.5–5.0)
ALK PHOS: 70 U/L (ref 38–126)
ALT: 13 U/L — ABNORMAL LOW (ref 14–54)
ANION GAP: 8 (ref 5–15)
AST: 19 U/L (ref 15–41)
BILIRUBIN TOTAL: 0.4 mg/dL (ref 0.3–1.2)
BUN: 11 mg/dL (ref 6–20)
CALCIUM: 9.3 mg/dL (ref 8.9–10.3)
CO2: 36 mmol/L — ABNORMAL HIGH (ref 22–32)
Chloride: 96 mmol/L — ABNORMAL LOW (ref 101–111)
Creatinine, Ser: 1.32 mg/dL — ABNORMAL HIGH (ref 0.44–1.00)
GFR calc Af Amer: 42 mL/min — ABNORMAL LOW (ref >60)
GFR, EST NON AFRICAN AMERICAN: 36 mL/min — AB (ref >60)
GLUCOSE: 102 mg/dL — AB (ref 65–99)
Potassium: 3.3 mmol/L — ABNORMAL LOW (ref 3.5–5.1)
Sodium: 140 mmol/L (ref 135–145)
TOTAL PROTEIN: 4.5 g/dL — AB (ref 6.5–8.1)

## 2017-02-06 LAB — CBC WITH DIFFERENTIAL/PLATELET
BASOS ABS: 0 10*3/uL (ref 0.0–0.1)
BASOS PCT: 0 %
Eosinophils Absolute: 0.3 10*3/uL (ref 0.0–0.7)
Eosinophils Relative: 3 %
HEMATOCRIT: 26.1 % — AB (ref 36.0–46.0)
Hemoglobin: 8.3 g/dL — ABNORMAL LOW (ref 12.0–15.0)
Lymphocytes Relative: 24 %
Lymphs Abs: 2.5 10*3/uL (ref 0.7–4.0)
MCH: 29.5 pg (ref 26.0–34.0)
MCHC: 31.8 g/dL (ref 30.0–36.0)
MCV: 92.9 fL (ref 78.0–100.0)
MONO ABS: 1.2 10*3/uL — AB (ref 0.1–1.0)
Monocytes Relative: 11 %
NEUTROS ABS: 6.7 10*3/uL (ref 1.7–7.7)
Neutrophils Relative %: 63 %
Platelets: 255 10*3/uL (ref 150–400)
RBC: 2.81 MIL/uL — ABNORMAL LOW (ref 3.87–5.11)
RDW: 13.8 % (ref 11.5–15.5)
WBC: 10.7 10*3/uL — ABNORMAL HIGH (ref 4.0–10.5)

## 2017-02-06 LAB — GLUCOSE, CAPILLARY
Glucose-Capillary: 84 mg/dL (ref 65–99)
Glucose-Capillary: 133 mg/dL — ABNORMAL HIGH (ref 65–99)

## 2017-02-06 MED ORDER — POTASSIUM CHLORIDE CRYS ER 20 MEQ PO TBCR: 20.0000 meq | EXTENDED_RELEASE_TABLET | Freq: Every day | ORAL | 0 refills | Status: AC

## 2017-02-06 MED ORDER — ROSUVASTATIN CALCIUM 40 MG PO TABS: 40.0000 mg | ORAL_TABLET | Freq: Every day | ORAL | Status: AC

## 2017-02-06 MED ORDER — ACETAMINOPHEN 325 MG PO TABS: 650.0000 mg | ORAL_TABLET | Freq: Four times a day (QID) | ORAL | Status: AC | PRN

## 2017-02-06 MED ORDER — CARBIDOPA-LEVODOPA ER 25-100 MG PO TBCR
1.0000 | EXTENDED_RELEASE_TABLET | Freq: Two times a day (BID) | ORAL | Status: DC
  Administered 2017-02-06: 1 via ORAL
  Filled 2017-02-06 (×2): qty 1

## 2017-02-06 MED ORDER — ROPINIROLE HCL 1 MG PO TABS: 1.0000 mg | ORAL_TABLET | Freq: Every day | ORAL | Status: AC

## 2017-02-06 MED ORDER — FUROSEMIDE 20 MG PO TABS: 20.0000 mg | ORAL_TABLET | Freq: Two times a day (BID) | ORAL | Status: AC

## 2017-02-06 MED ORDER — POTASSIUM CHLORIDE CRYS ER 20 MEQ PO TBCR
40.0000 meq | EXTENDED_RELEASE_TABLET | Freq: Once | ORAL | Status: AC
  Administered 2017-02-06: 40 meq via ORAL
  Filled 2017-02-06: qty 2

## 2017-02-06 MED ORDER — AMIODARONE HCL 200 MG PO TABS: ORAL_TABLET | ORAL | Status: AC

## 2017-02-06 MED ORDER — POTASSIUM CHLORIDE CRYS ER 20 MEQ PO TBCR: 20.0000 meq | EXTENDED_RELEASE_TABLET | Freq: Once | ORAL | 0 refills | Status: DC

## 2017-02-06 MED ORDER — BIOTIN 2500 MCG PO CAPS: 2500.0000 ug | ORAL_CAPSULE | Freq: Every day | ORAL | Status: AC

## 2017-02-06 MED ORDER — ALPRAZOLAM 0.25 MG PO TABS: 0.2500 mg | ORAL_TABLET | Freq: Three times a day (TID) | ORAL | 0 refills | Status: AC | PRN

## 2017-02-06 MED ORDER — TRAMADOL HCL 50 MG PO TABS: 50.0000 mg | ORAL_TABLET | Freq: Three times a day (TID) | ORAL | 0 refills | Status: AC | PRN

## 2017-02-06 NOTE — Discharge Summary (Signed)
Physician Discharge Summary  Heather Walters ZOX:[id number] DOB: 12-01-1931 DOA: 02/02/2017  PCP: Margaree Mackintosh, MD  Admit date: 02/02/2017 Discharge date: 02/06/2017  Admitted From: SNF Disposition: SNF Recommendations for Outpatient Follow-up:  1. Follow up with PCP in 2 weeks 2. Follow up with cardiology in 3-4 weeks 3. Please consult dietitian 4. Please resume eliquis 5 mg po BID on 02/15/17 (it is currently being held due to recent colon biopsies) 5. Please obtain BMP/CBC in 1 week 6. Resume nightly CPAP 7. Please monitor daily weights  Discharge Condition: STABLE  CODE STATUS: DNR   Brief/Interim Summary: HPI: Heather Walters is a 83 y.o. female with medical history significant of stroke/TIA on Eliquis since December, sleep apnea, hypertension, heart disease congestive heart failure, fibromyalgia, hypertension diabetes mellitus, COPD, dementia, anxiety and depression     Presented with found poorly responsive while in the nursing home facility Patient recently has been admitted at [facility] for 2 weeks with complicated stay initially presented with severe constipation associated abdominal pain CT scan was worrisome for mass. She developed A. fib with RVR which was a new diagnosis CHA2DS2.VAsc of 8 she was started on amiodarone 400 twice a day for 1 week will plan to change 200 once a day and Eliquis.  Colonoscopy showed 12 small colon polyps which were removed. Multiple polyps removal recommend holding off for at least 2 weeks on Eliquis  Patient was also diagnosed with HCAP with Zosyn and vancomycin she was noted to be hypoxic requiring 2 L of oxygen and discharge and doxycycline for 7 more days  She was found to have non-ST elevation MI and undergone left heart catheterization 28th of MAy  showed mild to moderate coronary artery disease  While hospitalized patient was noted to be confused which was to be secondary to delirium due to infection CT scan of the brain was not  obtained initilly secondary to instability. But was done on 31st and ws non acute  CT scan of abdomen showed thickening of transverse colon which is initially was worrisome for malignancy she undergone EGD and colonoscopy on 02/01/2017 and EGD showed esophagitis a GE junction mild nonobstructing Schatzki's ring, small hiatal hernia, mild gastritis, pigmented spot in the antrum of the stomach and multiple pigmented spots in the entire duodenum (suspect medication effect) eliquis was to be held for 2 weeks/  She was felt to be safe to resume anticoagulation on 02/15/2017  The plan was patient to be discharged to SNF on 6/7 to [facility].  For unclear reason she was supposed to be transferred to [facility] facility. Apparently the stuff was not aware that she actually did not get transferred due to being a weekend. The staff was not aware of her presence the nursing facility for what seems to be 24 hours. The family was at bedside and reports that she has not been able to stay awake enough to eat or drink all day. Her mental status have been fluctuating for the past 2 weeks and the family was told was delirium. But over the past 24 hours she completely deteriorated to the point where she was very difficult to arouse.  When she was found by nursing stuff next day she was very lethargic. It is possible the patient was not receiving any by mouth intake during that time. Her BG was checked and was  32 after receiving Glucose it was  Up to 150 but she remained somnolent. Family states that due to confusion on discharge paperwork she did receive  Eliquis yesterday. They are unsure of other medications.  Patient was transferred to [facility] ER. Arrival she remained poorly responsive CT scan of the head was done and showed left cerebellar CVA neurology was consulted  Patient has history of anxiety depression takes Paxil   IN ER:  Temp (24hrs), Avg:97.6 F (36.4 C), Min:97.6 F (36.4 C), Max:97.6 F (36.4 C)       on arrival      ED Triage Vitals  Enc Vitals Group     BP 02/02/17 2030 (!) 166/50     Pulse Rate 02/02/17 2030 73     Resp 02/02/17 2030 20     Temp 02/02/17 2049 97.6 F (36.4 C)     Temp Source 02/02/17 2049 Rectal     SpO2 02/02/17 2027 97 %     Weight 02/02/17 2031 190 lb (86.2 kg)     Height 02/02/17 2031 5\' 1"  (1.549 m)     Head Circumference --      Peak Flow --      Pain Score --      Pain Loc --      Pain Edu? --      Excl. in GC? --   RR 24 96% 3L HR 70 BP 147/49 VBG 7.372/53.1 Na 137 CR 1.35 WBC 11.4 hemoglobin 9.6 which is close to baseline INR 1.52  Ammonia 27 troponin 0.04 LA 0.78  Following Medications were ordered in ER: Medications - No data to display  ER provider discussed case with:Neurology   Brief Admission Hx: Heather Walters a 83 y.o.femalewith a history of dementia diagnosed last December who presents with worsening mental status over the past 24-48 hours. She was just discharged from [facility] yesterday after having been hospitalized there with nausea, found to have multiple polyps which were biopsied, A. fib with RVR. She was started on Eliquis, but then this was pulsed to biopsy multiple small colon polyps and she was recommended to be off Eliquis for 2 weeks.  Her family states that she was rather sleepy while in the hospital, and mildly confused. She has been much worse over the past 24 hours. Apparently, staff was not aware for presence there per previous notes.  On arrival, her blood glucose was 32.   MDM/Assessment & Plan:   Acute mental status changes - Resolved now, off bipap, mentating much better now.  Pt is DNR confirmed with husband. MRI with no acute CVA noted.   Neurology signed off.   Severe hypoglycemia - Unknown cause, not known to be on insulin, D10 infusion was used, BS stable now, tolerating diet.  No further low BS noted.   Acute respiratory failure - respiratory acidosis resolved now, treated  with continuous bipap and now on Darden.     Chronic diastolic CHF - seems overloaded today.  follow I/O closely. Follow weights.  Resume home lasix and other cardiac meds. AKI - gently hydrated. Follow Cr. It has improved.   OSA - CPAP nightly. Dementia - monitoring closely. Stable.  Family at bedside.   RLS - stable. Resume home meds.  Increased requip to 1 mg QHS.   GERD - GI protection ordered.  Chest pain with Afib with RVR - recently started on amiodarone at [facility], consulted cardiology and they have recommended amiodarone 200 mg BID thru 6/18, then starting 6/19 take 200 mg po daily.  Pt is on eliquis for anticoagulation but currently on hold on hold secondary to recent colon biopsies.  Eliquis should be restarted on 02/15/17. Troponin has been flat. No further work up recommended per cardiology.    DVT prophylaxis: SCDs Code Status: DNR Family Communication: daughter at bedside Disposition Plan: SNF   Consultants:  Neurology  Cardiology   Discharge Diagnoses:  Active Problems:   Hypertension   GERD (gastroesophageal reflux disease)   Hypoglycemia   Acute metabolic encephalopathy   A-fib (HCC)   Chronic diastolic CHF (congestive heart failure) (HCC)   Dehydration   CVA (cerebral vascular accident) (HCC)   AKI (acute kidney injury) [facility])  Discharge Instructions  Discharge Instructions    Increase activity slowly    Complete by:  As directed      Allergies as of 02/06/2017      Reactions   Codeine Other (See Comments)   Unknown reaction   Lipitor [atorvastatin] Other (See Comments)   Caused cramping in her legs      Medication List    STOP taking these medications   ELIQUIS 5 MG Tabs tablet Generic drug:  apixaban   promethazine 25 MG tablet Commonly known as:  PHENERGAN   sucralfate 1 g tablet Commonly known as:  CARAFATE     TAKE these medications   acetaminophen 325 MG tablet Commonly known as:  TYLENOL Take 2 tablets (650 mg total) by mouth every 6  (six) hours as needed for mild pain, moderate pain, fever or headache. What changed:  medication strength  how much to take  reasons to take this   albuterol 108 (90 Base) MCG/ACT inhaler Commonly known as:  PROVENTIL HFA;VENTOLIN HFA Inhale 2 puffs into the lungs every 6 (six) hours as needed for wheezing or shortness of breath.   albuterol (2.5 MG/3ML) 0.083% nebulizer solution Commonly known as:  PROVENTIL Take 2.5 mg by nebulization every 6 (six) hours as needed for wheezing or shortness of breath.   ALPRAZolam 0.25 MG tablet Commonly known as:  XANAX Take 1 tablet (0.25 mg total) by mouth 3 (three) times daily as needed for anxiety.   amiodarone 200 MG tablet Commonly known as:  PACERONE Take 2 tabs BID thru 6/18 then 1 tab daily starting 6/19 What changed:  how much to take  how to take this  when to take this  additional instructions   amLODipine 10 MG tablet Commonly known as:  NORVASC Take 10 mg by mouth daily.   aspirin EC 81 MG tablet Take 81 mg by mouth daily.   Biotin 2500 MCG Caps Take 2,500 mcg by mouth daily. What changed:  medication strength  how much to take   carvedilol 6.25 MG tablet Commonly known as:  COREG Take 6.25 mg by mouth 2 (two) times daily with a meal.   FEOSOL 325 (65 FE) MG tablet Generic drug:  ferrous sulfate Take 325 mg by mouth daily with breakfast.   furosemide 20 MG tablet Commonly known as:  LASIX Take 1 tablet (20 mg total) by mouth 2 (two) times daily. What changed:  medication strength   GLUCOSAMINE 1500 COMPLEX PO Take 1 tablet by mouth 2 (two) times daily.   hydrALAZINE 100 MG tablet Commonly known as:  APRESOLINE Take 100 mg by mouth 3 (three) times daily.   isosorbide mononitrate 60 MG 24 hr tablet Commonly known as:  IMDUR Take 60 mg by mouth daily.   multivitamin with minerals Tabs tablet Take 1 tablet by mouth daily.   nitroGLYCERIN 0.4 MG SL tablet Commonly known as:  NITROSTAT Place 0.4  mg under the  tongue every 5 (five) minutes as needed for chest pain.   omeprazole 40 MG capsule Commonly known as:  PRILOSEC Take 40 mg by mouth daily.   ondansetron 4 MG tablet Commonly known as:  ZOFRAN Take 4 mg by mouth every 8 (eight) hours as needed for nausea or vomiting.   potassium chloride SA 20 MEQ tablet Commonly known as:  K-DUR,KLOR-CON Take 1 tablet (20 mEq total) by mouth daily.   rOPINIRole 1 MG tablet Commonly known as:  REQUIP Take 1 tablet (1 mg total) by mouth at bedtime. What changed:  medication strength  how much to take   rosuvastatin 40 MG tablet Commonly known as:  CRESTOR Take 1 tablet (40 mg total) by mouth daily. What changed:  medication strength  how much to take   traMADol 50 MG tablet Commonly known as:  ULTRAM Take 1 tablet (50 mg total) by mouth every 8 (eight) hours as needed for severe pain.   traZODone 50 MG tablet Commonly known as:  DESYREL Take 75 mg by mouth at bedtime.      Follow-up Information    Margaree Mackintosh, MD. Schedule an appointment as soon as possible for a visit in 2 week(s).   Specialty:  Internal Medicine Contact information: [address] [place] [address] [phone]          Allergies  Allergen Reactions  . Codeine Other (See Comments)    Unknown reaction  . Lipitor [Atorvastatin] Other (See Comments)    Caused cramping in her legs   Procedures/Studies: Dg Chest 2 View  Result Date: 02/02/2017 CLINICAL DATA:  Altered mental status EXAM: CHEST  2 VIEW COMPARISON:  None. FINDINGS: Shallow lung inflation. Bilateral opacities suggesting pulmonary edema. No pneumothorax. Small left pleural effusion. Mild cardiomegaly. IMPRESSION: Shallow lung inflation and mild pulmonary edema. Electronically Signed   By: Deatra Robinson M.D.   On: 02/02/2017 21:30   Ct Head Wo Contrast  Result Date: 02/02/2017 CLINICAL DATA:  83 year old female with altered mental status. EXAM: CT HEAD  WITHOUT CONTRAST TECHNIQUE: Contiguous axial images were obtained from the base of the skull through the vertex without intravenous contrast. COMPARISON:  None. FINDINGS: Brain: Hypodensity within the left cerebellum likely represents an acute to subacute infarct. There is no evidence of hemorrhage, midline shift or hydrocephalus. Mild chronic small-vessel white matter ischemic changes are noted. Vascular: Intracranial atherosclerotic calcifications noted. Skull: Normal. Negative for fracture or focal lesion. Sinuses/Orbits: No acute finding. Other: None. IMPRESSION: Left cerebellar hypodensity compatible with infarct-likely acute to subacute. No evidence of hemorrhage. Mild chronic small-vessel white matter ischemic changes. Electronically Signed   By: Harmon Pier M.D.   On: 02/02/2017 21:27   Mr Maxine Glenn Head Wo Contrast  Result Date: 02/03/2017 CLINICAL DATA:  Initial evaluation for worsening mental status. EXAM: MRI HEAD WITHOUT CONTRAST MRA HEAD WITHOUT CONTRAST TECHNIQUE: Multiplanar, multiecho pulse sequences of the brain and surrounding structures were obtained without intravenous contrast. Angiographic images of the head were obtained using MRA technique without contrast. COMPARISON:  Prior CT from 02/02/2017. FINDINGS: MRI HEAD FINDINGS Brain: Study limited as the patient was unable to tolerate the full length of the exam. Generalized age appropriate cerebral atrophy. T2/FLAIR hyperintensity within the periventricular and deep white matter both cerebral hemispheres most likely related to chronic small vessel ischemic disease, mild for age. Chronic microvascular changes present within the pons. Patchy T2/FLAIR signal abnormality seen within the peripheral left cerebellar hemisphere noted (Series 11, image 6), most likely related to  prior ischemic infarct. This corresponds with previously noted hypodensity on prior CT. No abnormal foci of restricted diffusion to suggest acute or subacute ischemia. Gray-white  matter differentiation otherwise maintained. No other areas of encephalomalacia to suggest chronic infarction. No evidence for acute or chronic intracranial hemorrhage. No mass lesion, midline shift or mass effect. Ventricles normal size without evidence for hydrocephalus. No extra-axial fluid collection. Major dural sinuses are grossly patent. Midline structures intact and normal. Pituitary gland grossly unremarkable, although not well evaluated on this motion degraded exam. Vascular: Major intracranial vascular flow voids are maintained. Skull and upper cervical spine: Craniocervical junction within normal limits. Visualized upper cervical spine unremarkable. Bone marrow signal intensity normal. No scalp soft tissue abnormality. Sinuses/Orbits: Globes and orbital soft tissues within normal limits. Mild scattered mucosal thickening within the paranasal sinuses. No air-fluid level to suggest active sinus infection. Left mastoid effusion noted. Nasopharynx grossly clear. Inner ear structures grossly unremarkable. MRA HEAD FINDINGS ANTERIOR CIRCULATION: Study moderately degraded by motion artifact. Distal cervical segments of the internal carotid arteries are patent with antegrade flow. Petrous, cavernous, and supraclinoid segments widely patent without flow-limiting stenosis. Ophthalmic arteries patent bilaterally. ICA termini widely patent. A1 segments well opacified bilaterally. Anterior cerebral artery is widely patent to their distal aspects. M1 segments patent without stenosis or occlusion. No proximal M2 occlusion. Distal MCA branches well opacified and symmetric. Mild small vessel atheromatous irregularity. POSTERIOR CIRCULATION: Vertebral arteries codominant and widely patent to the vertebrobasilar junction. Posterior inferior cerebral arteries patent proximally. Basilar artery widely patent to its distal aspect. Superior cerebral arteries patent bilaterally. Both of the posterior cerebral artery is supplied  primarily via the basilar and are widely patent to their distal aspects. Mild atheromatous irregularity within the PCAs bilaterally without high-grade stenosis. Small bilateral posterior communicating arteries noted No aneurysm or vascular malformation. IMPRESSION: MRI HEAD IMPRESSION: 1. No acute intracranial infarct or other process identified. 2. Remote left cerebellar infarct. This accounts for previously noted hypodensity seen on prior CT. 3. Mild for age chronic small vessel ischemic disease. 4. Left mastoid effusion. MRA HEAD IMPRESSION: 1. Normal MRA appearance of the large and medium sized vessels of the intracranial circulation. No large vessel occlusion. No high-grade or correctable stenosis. 2. Mild distal small vessel atheromatous irregularity. Electronically Signed   By: Rise Mu M.D.   On: 02/03/2017 03:24   Mr Brain Wo Contrast  Result Date: 02/03/2017 CLINICAL DATA:  Initial evaluation for worsening mental status. EXAM: MRI HEAD WITHOUT CONTRAST MRA HEAD WITHOUT CONTRAST TECHNIQUE: Multiplanar, multiecho pulse sequences of the brain and surrounding structures were obtained without intravenous contrast. Angiographic images of the head were obtained using MRA technique without contrast. COMPARISON:  Prior CT from 02/02/2017. FINDINGS: MRI HEAD FINDINGS Brain: Study limited as the patient was unable to tolerate the full length of the exam. Generalized age appropriate cerebral atrophy. T2/FLAIR hyperintensity within the periventricular and deep white matter both cerebral hemispheres most likely related to chronic small vessel ischemic disease, mild for age. Chronic microvascular changes present within the pons. Patchy T2/FLAIR signal abnormality seen within the peripheral left cerebellar hemisphere noted (Series 11, image 6), most likely related to prior ischemic infarct. This corresponds with previously noted hypodensity on prior CT. No abnormal foci of restricted diffusion to suggest  acute or subacute ischemia. Gray-white matter differentiation otherwise maintained. No other areas of encephalomalacia to suggest chronic infarction. No evidence for acute or chronic intracranial hemorrhage. No mass lesion, midline shift or mass effect. Ventricles normal size without evidence  for hydrocephalus. No extra-axial fluid collection. Major dural sinuses are grossly patent. Midline structures intact and normal. Pituitary gland grossly unremarkable, although not well evaluated on this motion degraded exam. Vascular: Major intracranial vascular flow voids are maintained. Skull and upper cervical spine: Craniocervical junction within normal limits. Visualized upper cervical spine unremarkable. Bone marrow signal intensity normal. No scalp soft tissue abnormality. Sinuses/Orbits: Globes and orbital soft tissues within normal limits. Mild scattered mucosal thickening within the paranasal sinuses. No air-fluid level to suggest active sinus infection. Left mastoid effusion noted. Nasopharynx grossly clear. Inner ear structures grossly unremarkable. MRA HEAD FINDINGS ANTERIOR CIRCULATION: Study moderately degraded by motion artifact. Distal cervical segments of the internal carotid arteries are patent with antegrade flow. Petrous, cavernous, and supraclinoid segments widely patent without flow-limiting stenosis. Ophthalmic arteries patent bilaterally. ICA termini widely patent. A1 segments well opacified bilaterally. Anterior cerebral artery is widely patent to their distal aspects. M1 segments patent without stenosis or occlusion. No proximal M2 occlusion. Distal MCA branches well opacified and symmetric. Mild small vessel atheromatous irregularity. POSTERIOR CIRCULATION: Vertebral arteries codominant and widely patent to the vertebrobasilar junction. Posterior inferior cerebral arteries patent proximally. Basilar artery widely patent to its distal aspect. Superior cerebral arteries patent bilaterally. Both of the  posterior cerebral artery is supplied primarily via the basilar and are widely patent to their distal aspects. Mild atheromatous irregularity within the PCAs bilaterally without high-grade stenosis. Small bilateral posterior communicating arteries noted No aneurysm or vascular malformation. IMPRESSION: MRI HEAD IMPRESSION: 1. No acute intracranial infarct or other process identified. 2. Remote left cerebellar infarct. This accounts for previously noted hypodensity seen on prior CT. 3. Mild for age chronic small vessel ischemic disease. 4. Left mastoid effusion. MRA HEAD IMPRESSION: 1. Normal MRA appearance of the large and medium sized vessels of the intracranial circulation. No large vessel occlusion. No high-grade or correctable stenosis. 2. Mild distal small vessel atheromatous irregularity. Electronically Signed   By: Rise MuBenjamin  McClintock M.D.   On: 02/03/2017 03:24   Dg Chest Port 1 View  Result Date: 02/05/2017 CLINICAL DATA:  Chest pain EXAM: PORTABLE CHEST 1 VIEW COMPARISON:  February 04, 2017 FINDINGS: There is persistent interstitial edema with minimal right pleural effusion. There is no airspace consolidation. There is cardiomegaly with pulmonary venous hypertension. No adenopathy. No bone lesions evident. IMPRESSION: Evidence of congestive heart failure. No airspace consolidation. Stable cardiac silhouette. Electronically Signed   By: Bretta BangWilliam  Woodruff III M.D.   On: 02/05/2017 07:36   Dg Chest Port 1 View  Result Date: 02/04/2017 CLINICAL DATA:  Congestive heart failure. EXAM: PORTABLE CHEST 1 VIEW COMPARISON:  02/02/2017 radiographs FINDINGS: Cardiomegaly and bilateral airspace opacities and trace bilateral pleural effusions again noted. There is no evidence of pneumothorax. There has been little interval change since the prior study. IMPRESSION: Unchanged appearance of the chest with bilateral airspace opacities and trace bilateral pleural effusions. Electronically Signed   By: Harmon PierJeffrey  Hu M.D.    On: 02/04/2017 07:47     Subjective: Pt says she is feeling better today.    Discharge Exam: Vitals:   02/06/17 0434 02/06/17 0837  BP: (!) 129/44 (!) 123/34  Pulse: 77 69  Resp: 18   Temp: 97.6 F (36.4 C) 97.5 F (36.4 C)   Vitals:   02/05/17 1358 02/05/17 2225 02/06/17 0434 02/06/17 0837  BP: (!) 137/44 (!) 115/38 (!) 129/44 (!) 123/34  Pulse: 65 (!) 54 77 69  Resp: 18  18   Temp: 97.9 F (36.6 C) 98.9  F (37.2 C) 97.6 F (36.4 C) 97.5 F (36.4 C)  TempSrc:   Oral Oral  SpO2: 100% 98% 99% 99%  Weight:      Height:        General: Pt is alert, awake, not in acute distress Cardiovascular:  S1/S2 +, no rubs, no gallops Respiratory: CTA bilaterally, no wheezing, no rhonchi Abdominal: Soft, NT, ND, bowel sounds + Extremities: 1+ edema, no cyanosis Neurological: nonfocal exam.    The results of significant diagnostics from this hospitalization (including imaging, microbiology, ancillary and laboratory) are listed below for reference.     Microbiology: No results found for this or any previous visit (from the past 240 hour(s)).   Labs: BNP (last 3 results)  Recent Labs  08/30/16 0530  BNP 191.8*   Basic Metabolic Panel:  Recent Labs Lab 02/02/17 2135 02/04/17 0140 02/05/17 1522 02/06/17 0415  NA 137 139 140 140  K 4.0 3.6 3.2* 3.3*  CL 103 101 97* 96*  CO2 24 30 31  36*  GLUCOSE 153* 105* 133* 102*  BUN 14 8 10 11   CREATININE 1.35* 1.18* 1.24* 1.32*  CALCIUM 9.4 9.6 9.5 9.3  MG  --   --  1.0*  --    Liver Function Tests:  Recent Labs Lab 02/02/17 2135 02/04/17 0140 02/05/17 1522 02/06/17 0415  AST 23 18 21 19   ALT 14 14 14  13*  ALKPHOS 68 68 72 70  BILITOT 0.7 0.6 0.6 0.4  PROT 5.2* 4.8* 4.9* 4.5*  ALBUMIN 2.4* 2.2* 2.3* 2.0*   No results for input(s): LIPASE, AMYLASE in the last 168 hours.  Recent Labs Lab 02/02/17 2135  AMMONIA 27   CBC:  Recent Labs Lab 02/02/17 2135 02/04/17 0140 02/06/17 0415  WBC 11.4* 10.8* 10.7*   NEUTROABS  --  6.9 6.7  HGB 9.6* 9.0* 8.3*  HCT 31.2* 29.4* 26.1*  MCV 94.0 94.8 92.9  PLT 293 299 255   Cardiac Enzymes:  Recent Labs Lab 02/03/17 0609 02/03/17 1257 02/05/17 0152 02/05/17 0619 02/05/17 1258  TROPONINI 0.04* 0.03* 0.04* 0.04* 0.03*   BNP: Invalid input(s): POCBNP CBG:  Recent Labs Lab 02/05/17 1207 02/05/17 1716 02/05/17 2144 02/06/17 0807 02/06/17 1212  GLUCAP 119* 162* 153* 84 133*   D-Dimer No results for input(s): DDIMER in the last 72 hours. Hgb A1c No results for input(s): HGBA1C in the last 72 hours. Lipid Profile No results for input(s): CHOL, HDL, LDLCALC, TRIG, CHOLHDL, LDLDIRECT in the last 72 hours. Thyroid function studies No results for input(s): TSH, T4TOTAL, T3FREE, THYROIDAB in the last 72 hours.  Invalid input(s): FREET3 Anemia work up No results for input(s): VITAMINB12, FOLATE, FERRITIN, TIBC, IRON, RETICCTPCT in the last 72 hours. Urinalysis    Component Value Date/Time   COLORURINE YELLOW 02/02/2017 2215   APPEARANCEUR HAZY (A) 02/02/2017 2215   LABSPEC 1.008 02/02/2017 2215   PHURINE 5.0 02/02/2017 2215   GLUCOSEU NEGATIVE 02/02/2017 2215   HGBUR SMALL (A) 02/02/2017 2215   BILIRUBINUR NEGATIVE 02/02/2017 2215   KETONESUR NEGATIVE 02/02/2017 2215   PROTEINUR NEGATIVE 02/02/2017 2215   NITRITE NEGATIVE 02/02/2017 2215   LEUKOCYTESUR LARGE (A) 02/02/2017 2215   Sepsis Labs Invalid input(s): PROCALCITONIN,  WBC,  LACTICIDVEN Microbiology No results found for this or any previous visit (from the past 240 hour(s)).  Time coordinating discharge: 45 mins  SIGNED:  Standley Dakins, MD  [facility] 02/06/2017, 1:09 PM Pager [phone]  If 7PM-7AM, please contact night-coverage [email or web address] Password [email or web address]

## 2017-02-06 NOTE — Evaluation (Signed)
Occupational Therapy Evaluation Patient Details Name: Heather Walters MRN: [id number] DOB: July 06, 1932 Today's Date: 02/06/2017    History of Present Illness Heather Walters is a 83 y.o. female with history of previous stroke, dementia, neuropathy, aortic stenosis, hypertension, hyperlipidemia, fibromyalgia, anxiety and depression presenting with sleepiness and mild confusion; was hypoglycemic upon arrival to ED   Clinical Impression   Limited evaluation due to pt feeling sleepy and wanting to remain in bed. Pt cooperative. Presents with generalized weakness, anxiety and baseline impairment in cognition. Husband very attentive to pt, both are in agreement with short term rehab in SNF. Will defer further OT to SNF.    Follow Up Recommendations  SNF;Supervision/Assistance - 24 hour    Equipment Recommendations       Recommendations for Other Services       Precautions / Restrictions Precautions Precautions: Fall Restrictions Weight Bearing Restrictions: No      Mobility Bed Mobility Overal bed mobility: Needs Assistance Bed Mobility: Supine to Sit;Sit to Supine     Supine to sit: Mod assist Sit to supine: Mod assist   General bed mobility comments: assist to raise trunk and for LEs back into bed  Transfers                 General transfer comment: pt declined OOB this visit, wanted to sleep    Balance Overall balance assessment: Needs assistance   Sitting balance-Leahy Scale: Fair Sitting balance - Comments: able to sit on EOB without assist                                    ADL either performed or assessed with clinical judgement   ADL Overall ADL's : Needs assistance/impaired Eating/Feeding: Set up;Sitting   Grooming: Minimal assistance;Sitting;Brushing hair   Upper Body Bathing: Moderate assistance;Sitting   Lower Body Bathing: Total assistance   Upper Body Dressing : Minimal assistance;Sitting   Lower Body Dressing: Total  assistance                       Vision Patient Visual Report: No change from baseline       Perception     Praxis      Pertinent Vitals/Pain Pain Assessment: Faces Faces Pain Scale: Hurts a little bit Pain Location: buttocks Pain Descriptors / Indicators: Sore Pain Intervention(s): Repositioned     Hand Dominance Right   Extremity/Trunk Assessment Upper Extremity Assessment Upper Extremity Assessment: Generalized weakness   Lower Extremity Assessment Lower Extremity Assessment: Defer to PT evaluation       Communication Communication Communication: No difficulties   Cognition Arousal/Alertness: Awake/alert Behavior During Therapy: Anxious Overall Cognitive Status: History of cognitive impairments - at baseline                                 General Comments: pt with hx of dementia, looks to husband to answer hx questions   General Comments       Exercises     Shoulder Instructions      Home Living Family/patient expects to be discharged to:: Skilled nursing facility                             Home Equipment: Gilmer Mor - single point;Walker - 4 wheels  Prior Functioning/Environment Level of Independence: Needs assistance  Gait / Transfers Assistance Needed: walks with RW and assist of her husband ADL's / Homemaking Assistance Needed: sponge bathes, assist for LB, husband does all IADL   Comments: sometimes walks away from walker and cane        OT Problem List: Decreased strength;Decreased activity tolerance;Impaired balance (sitting and/or standing);Decreased cognition;Decreased knowledge of use of DME or AE;Obesity      OT Treatment/Interventions:      OT Goals(Current goals can be found in the care plan section) Acute Rehab OT Goals Patient Stated Goal: agreeable to SNF for rehab  OT Frequency:     Barriers to D/C:            Co-evaluation              AM-PAC PT "6 Clicks" Daily Activity      Outcome Measure Help from another person eating meals?: A Little Help from another person taking care of personal grooming?: A Little Help from another person toileting, which includes using toliet, bedpan, or urinal?: A Lot Help from another person bathing (including washing, rinsing, drying)?: A Lot Help from another person to put on and taking off regular upper body clothing?: A Little Help from another person to put on and taking off regular lower body clothing?: A Lot 6 Click Score: 15   End of Session Equipment Utilized During Treatment: Oxygen  Activity Tolerance: Patient tolerated treatment well Patient left: in bed;with call bell/phone within reach;with family/visitor present  OT Visit Diagnosis: Unsteadiness on feet (R26.81);Other abnormalities of gait and mobility (R26.89);Muscle weakness (generalized) (M62.81);Other symptoms and signs involving cognitive function                Time: [id number] OT Time Calculation (min): 18 min Charges:  OT General Charges $OT Visit: 1 Procedure OT Evaluation $OT Eval Moderate Complexity: 1 Procedure G-Codes:      Evern BioMayberry, Cortne Amara Lynn 02/06/2017, 11:54 AM  [phone]

## 2017-02-06 NOTE — Clinical Social Work Placement (Signed)
   CLINICAL SOCIAL WORK PLACEMENT  NOTE  Date:  02/06/2017  Patient Details  Name: Heather Walters MRN: [id number] Date of Birth: 07-Nov-1931  Clinical Social Work is seeking post-discharge placement for this patient at the Skilled  Nursing Facility level of care (*CSW will initial, date and re-position this form in  chart as items are completed):  Yes   Patient/family provided with [facility] Clinical Social Work Department's list of facilities offering this level of care within the geographic area requested by the patient (or if unable, by the patient's family).  Yes   Patient/family informed of their freedom to choose among providers that offer the needed level of care, that participate in Medicare, Medicaid or managed care program needed by the patient, have an available bed and are willing to accept the patient.  Yes   Patient/family informed of [facility]'s ownership interest in [facility] and [facility], as well as of the fact that they are under no obligation to receive care at these facilities.  PASRR submitted to EDS on       PASRR number received on       Existing PASRR number confirmed on 02/06/17     FL2 transmitted to all facilities in geographic area requested by pt/family on 02/06/17     FL2 transmitted to all facilities within larger geographic area on       Patient informed that his/her managed care company has contracts with or will negotiate with certain facilities, including the following:        Yes   Patient/family informed of bed offers received.  [facility]     Physician recommends and patient chooses bed at      [facility].  Patient to be transferred to facility by [facility]AR     Patient family notified on 02/06/17 of transfer.  Name of family member notified:  Daughter     PHYSICIAN       Additional Comment:     _______________________________________________ Mearl LatinNadia S Locklyn Henriquez, LCSWA 02/06/2017, 1:53 PM

## 2017-02-06 NOTE — Progress Notes (Signed)
Patient will DC to: [facility] Anticipated DC date: 02/06/17 Family notified: Daughter Transport by: Sharin MonsPTAR   Per MD patient ready for DC to [facility]. RN, patient, patient's family, and facility notified of DC. Discharge Summary sent to facility. RN given number for report [phone]). DC packet on chart. Ambulance transport requested for patient.   CSW signing off.  Cristobal GoldmannNadia Bennette Hasty, [place] Clinical Social Worker [phone]

## 2017-02-06 NOTE — Progress Notes (Signed)
Progress Note  Patient Name: Heather Walters Date of Encounter: 02/06/2017  Primary Cardiologist: Dr. Mayford Knifeurner  Subjective   Denies chest pain, SOB or palpitations  Inpatient Medications    Scheduled Meds: . amiodarone  200 mg Oral BID   Followed by  . [START ON 02/13/2017] amiodarone  200 mg Oral Daily  . amLODipine  10 mg Oral Daily  . aspirin EC  81 mg Oral Daily  . Carbidopa-Levodopa ER  1 tablet Oral BID  . carvedilol  12.5 mg Oral BID WC  . ferrous sulfate  325 mg Oral Q breakfast  . furosemide  20 mg Oral BID  . isosorbide mononitrate  60 mg Oral Daily  . multivitamin with minerals  1 tablet Oral Daily  . pantoprazole  40 mg Oral Daily  . potassium chloride  40 mEq Oral Once  . rOPINIRole  0.5 mg Oral QHS  . rosuvastatin  40 mg Oral Daily  . traZODone  75 mg Oral QHS   Continuous Infusions:  PRN Meds: acetaminophen **OR** acetaminophen (TYLENOL) oral liquid 160 mg/5 mL **OR** acetaminophen, albuterol, ALPRAZolam, metoprolol tartrate, morphine injection, nitroGLYCERIN, ondansetron   Vital Signs    Vitals:   02/05/17 1358 02/05/17 2225 02/06/17 0434 02/06/17 0837  BP: (!) 137/44 (!) 115/38 (!) 129/44 (!) 123/34  Pulse: 65 (!) 54 77 69  Resp: 18  18   Temp: 97.9 F (36.6 C) 98.9 F (37.2 C) 97.6 F (36.4 C) 97.5 F (36.4 C)  TempSrc:   Oral Oral  SpO2: 100% 98% 99% 99%  Weight:      Height:        Intake/Output Summary (Last 24 hours) at 02/06/17 1129 Last data filed at 02/06/17 [id number]  Gross per 24 hour  Intake                0 ml  Output              350 ml  Net             -350 ml   Filed Weights   02/02/17 2031  Weight: 190 lb (86.2 kg)    Telemetry    NSR - Personally Reviewed  ECG    NSR with anterior infarct - Personally Reviewed  Physical Exam   GEN: No acute distress.   Neck: No JVD Cardiac: RRR, no murmurs, rubs, or gallops.  Respiratory: Clear to auscultation bilaterally. GI: Soft, nontender, non-distended  MS: No edema; No  deformity. Neuro:  Nonfocal  Psych: Normal affect   Labs    Chemistry Recent Labs Lab 02/04/17 0140 02/05/17 1522 02/06/17 0415  NA 139 140 140  K 3.6 3.2* 3.3*  CL 101 97* 96*  CO2 30 31 36*  GLUCOSE 105* 133* 102*  BUN 8 10 11   CREATININE 1.18* 1.24* 1.32*  CALCIUM 9.6 9.5 9.3  PROT 4.8* 4.9* 4.5*  ALBUMIN 2.2* 2.3* 2.0*  AST 18 21 19   ALT 14 14 13*  ALKPHOS 68 72 70  BILITOT 0.6 0.6 0.4  GFRNONAA 42* 39* 36*  GFRAA 48* 46* 42*  ANIONGAP 8 12 8      Hematology Recent Labs Lab 02/02/17 2135 02/04/17 0140 02/06/17 0415  WBC 11.4* 10.8* 10.7*  RBC 3.32* 3.10* 2.81*  HGB 9.6* 9.0* 8.3*  HCT 31.2* 29.4* 26.1*  MCV 94.0 94.8 92.9  MCH 28.9 29.0 29.5  MCHC 30.8 30.6 31.8  RDW 14.2 14.2 13.8  PLT 293 299 255    Cardiac Enzymes  Recent Labs Lab 02/03/17 1257 02/05/17 0152 02/05/17 0619 02/05/17 1258  TROPONINI 0.03* 0.04* 0.04* 0.03*    Recent Labs Lab 02/02/17 2112  TROPIPOC 0.04     BNPNo results for input(s): BNP, PROBNP in the last 168 hours.   DDimer No results for input(s): DDIMER in the last 168 hours.   Radiology    Dg Chest Port 1 View  Result Date: 02/05/2017 CLINICAL DATA:  Chest pain EXAM: PORTABLE CHEST 1 VIEW COMPARISON:  February 04, 2017 FINDINGS: There is persistent interstitial edema with minimal right pleural effusion. There is no airspace consolidation. There is cardiomegaly with pulmonary venous hypertension. No adenopathy. No bone lesions evident. IMPRESSION: Evidence of congestive heart failure. No airspace consolidation. Stable cardiac silhouette. Electronically Signed   By: Bretta Bang III M.D.   On: 02/05/2017 07:36    Cardiac Studies   2D echo Study Conclusions  - Left ventricle: The cavity size was normal. Wall thickness was   increased in a pattern of mild LVH. Systolic function was normal.   The estimated ejection fraction was in the range of 60% to 65%.   Left ventricular diastolic function parameters were  normal. - Aortic valve: There was mild stenosis. Valve area (VTI): 1.81   cm^2. Valve area (Vmax): 1.52 cm^2. Valve area (Vmean): 1.41   cm^2. - Left atrium: The atrium was mildly dilated. - Atrial septum: No defect or patent foramen ovale was identified. - Pulmonary arteries: PA peak pressure: 39 mm Hg (S).  Patient Profile     83 y.o. female with history of mild AS, HTN, nonobstructive CAD in setting of NSTEMI 12/2016 with cath showing 50% mid LAD and 30% OM1 o/w normal coronary arteries and ? Chronic diastolic CHF when she presented with Nausea and constipation and went into afib with RVR at [facility]. She was placed on IV Cardizem gtt and transitioned to Amio PO.  She was started on Eliqis but found to have multiple colon polyps on CT and Eliquis was held and colonoscopy was done with multiple bx (12).  She was instructed to hold Eliquis for 2 weeks post bx.  She then went to [facility] and had worsening MS and anasarca and was transferred to [facility].  She was mistakenly given Insulin and BS was 32 and she had a respiratory acidosis. MRA of head was normal. She was noted to have several episodes of PAF with RVR overnight and Cardiology is asked to consult  Assessment & Plan    1. New onset atrial fibrillation - per Care Everywhere, she had Afib RVR at [facility] during her hospitalization there this month. She converted on a diltiazem drip to NSR and was transitioned to PO amiodarone when she passed her swallow evaluation. She started 400 mg BID amiodarone on 01/25/17 and then transitioned to 200 mg daily.  GI recommended holding eliquis for 2 weeks following her polyp biopsy, to be resumed on 02/15/17.  - Amiodarone 200 mg daily has been resumed during this admission but she had had bouts of afib with RVR. She is currently in NSR. She was given Amo 400mg  BID for 1 week at [facility] hospital and then decreased to 200mg  daily.  She has likely not had an adequate Amio load.   - Amio increased to 200mg  BID  for 1 week and then 200mg  daily. - Recommend restarting eliquis when safe to do so, will defer this decision to Medicine service.  2. Elevated troponin - 0.04 --> 0.04 --> 0.03 -  mildly elevated and flat trend, not in a pattern that would be consistent for ischemia - recent cath last month with nonobstructive CAD. - likely elevated in the setting of recent PNA, confusion, and RVR  - no further workup at this time  3.  ASCAD - nonobstructive by recent cath with 50% mid LAD and 30% OM - continue ASA, statin, BB and long acting nitrates  4. HTN- controlled - continue amlodipine, coreg, and imdur - BP has been stable and recommended to run high, per neurology  5. HLD - continue statin  6. Mild aortic stenosis - stable  7. AKI - sCr 1.32 (1.35) - baseline sCr 0.75-0.93  8. Hypokalemia - replete - keep K> 4 - repeat BMET in am  9. Edema - echocardiogram without signs of LV dysfunction and normal diastolic function - BNP not collected on admission - albumin is 2.2; she is likely third-spacing fluid. Will hold off on lasix for now - consider nutrition consult  Signed, Armanda Magic, MD  02/06/2017, 11:29 AM

## 2017-02-06 NOTE — Progress Notes (Signed)
Report called to [facility]. Madelin RearLonnie Talor Cheema, MSN, RN, [employer]CMSRN

## 2017-02-06 NOTE — Progress Notes (Signed)
[facility] has received insurance approval for patient to admit there at discharge.  Osborne Cascoadia Shantae Vantol LCSWA [phone]

## 2017-02-24 DIAGNOSIS — J9601 Acute respiratory failure with hypoxia: Secondary | ICD-10-CM

## 2017-02-24 DIAGNOSIS — F0390 Unspecified dementia without behavioral disturbance: Secondary | ICD-10-CM

## 2017-02-24 DIAGNOSIS — R110 Nausea: Secondary | ICD-10-CM

## 2017-02-24 DIAGNOSIS — R0602 Shortness of breath: Secondary | ICD-10-CM

## 2017-02-24 DIAGNOSIS — I110 Hypertensive heart disease with heart failure: Secondary | ICD-10-CM

## 2017-02-24 DIAGNOSIS — J690 Pneumonitis due to inhalation of food and vomit: Secondary | ICD-10-CM

## 2017-02-24 DIAGNOSIS — I350 Nonrheumatic aortic (valve) stenosis: Secondary | ICD-10-CM

## 2017-02-24 DIAGNOSIS — G4700 Insomnia, unspecified: Secondary | ICD-10-CM

## 2017-02-24 DIAGNOSIS — D649 Anemia, unspecified: Secondary | ICD-10-CM

## 2017-02-24 DIAGNOSIS — G4733 Obstructive sleep apnea (adult) (pediatric): Secondary | ICD-10-CM

## 2017-02-24 DIAGNOSIS — I272 Pulmonary hypertension, unspecified: Secondary | ICD-10-CM

## 2017-02-24 DIAGNOSIS — I5033 Acute on chronic diastolic (congestive) heart failure: Secondary | ICD-10-CM

## 2017-02-25 DIAGNOSIS — I359 Nonrheumatic aortic valve disorder, unspecified: Secondary | ICD-10-CM | POA: Diagnosis not present

## 2017-02-25 DIAGNOSIS — I472 Ventricular tachycardia: Secondary | ICD-10-CM | POA: Diagnosis not present

## 2017-02-26 ENCOUNTER — Ambulatory Visit: Payer: Medicare HMO | Admitting: Physician Assistant

## 2017-02-26 ENCOUNTER — Encounter: Payer: Self-pay | Admitting: Physician Assistant

## 2017-02-26 NOTE — Progress Notes (Deleted)
Cardiology Office Note    Date:  02/26/2017  ID:  Heather Walters, DOB March 30, 1932, MRN [id number] PCP:  Margaree Mackintosh, MD  Cardiologist:  Dr. Mayford Knife   Chief Complaint: f/u atrial fib  History of Present Illness:  Heather Walters is a 83 y.o. female with history of HTN, HLD, mild AS, nonobstructive CAD (NSTEMI 12/2016 with 50% mLAD and 30% OM1 at [facility]), stroke, depression, anxiety, fibromyalgia, neuropathy, question of chronic diastolic CHF (normal diastolic parameters by echo 2018) who presents for f/u of atrial fib.  Per review of chart, she was recently hospitalized at [facility] for nausea. She was found to have multiple colon polyps that were biopsied. She also had mild confusion and afib RVR placed on amiodarone and Eliquis. Provider at [facility] recommended holding Eliquis for 2 weeks following polyp biopsies. She was discharged back to her SNF but re-admitted to [facility] in 01/2017 with altered mental status, hypoglycemia (mistakenly given insulin at SNF), and head CT showed left cerebellar hypodensity compatible with infarct (acute to subacute). MRA normal. She also had AKI and hypoalbuminemia (2.2). During admission cardiology was consulted due to PAF with RVR. She had minimally elevated tropnin felt due to demand process. She had mild anemia felt due to low albumin. Carvedilol was increased along with recommendation to increase amiodarone to 200mg  BID x 1 week then 200mg  daily. 2D Echo 02/04/17 showed mild LVH, EF 60-65%, normal diastolic parameters, mild aortic stenosis, mild LAE. Last labs 02/06/17 showed K 3.3, Cr 1.32, albumin 2.0, other pertinent labs include Mg 1.0 and peak troponin of 0.04, TSH wnl.  bmet mg    Paroxysmal atrial fib Hypokalemia/hypomagnesemia Acute kidney injury CAD     Past Medical History:  Diagnosis Date  . Anxiety   . Arthritis   . Coronary artery disease    a. NSTEMI 12/2016 with 50% mid LAD and 30% OM1 by cath at [facility].  Marland Kitchen Depression   .  Fibromyalgia   . GERD (gastroesophageal reflux disease)   . Hyperlipidemia   . Hypertension   . Intervertebral disk disease    lumbar  . Mild aortic stenosis 2018  . Neuropathy   . PAF (paroxysmal atrial fibrillation) (HCC)   . Stroke (HCC)   . Vitamin D deficiency     Past Surgical History:  Procedure Laterality Date  . ABDOMINAL HYSTERECTOMY    . CARDIAC CATHETERIZATION    . CHOLECYSTECTOMY    . COLONOSCOPY    . ESOPHAGOGASTRODUODENOSCOPY ENDOSCOPY    . JOINT REPLACEMENT     right total knee    Current Medications: No outpatient prescriptions have been marked as taking for the 02/26/17 encounter (Appointment) with Laurann Montana, PA-C.     Allergies:   Codeine and Lipitor [atorvastatin]   Social History   Social History  . Marital status: Married    Spouse name: N/A  . Number of children: N/A  . Years of education: N/A   Social History Main Topics  . Smoking status: Never Smoker  . Smokeless tobacco: Never Used  . Alcohol use No  . Drug use: No  . Sexual activity: No   Other Topics Concern  . Not on file   Social History Narrative  . No narrative on file     Family History:  Family History  Problem Relation Age of Onset  . Diabetes Mother   . CAD Mother   . Cancer Mother   . Diabetes Father    ***  ROS:  Please see the history of present illness. Otherwise, review of systems is positive for ***.  All other systems are reviewed and otherwise negative.    PHYSICAL EXAM:   VS:  There were no vitals taken for this visit.  BMI: There is no height or weight on file to calculate BMI. GEN: Well nourished, well developed, in no acute distress  HEENT: normocephalic, atraumatic Neck: no JVD, carotid bruits, or masses Cardiac: ***RRR; no murmurs, rubs, or gallops, no edema  Respiratory:  clear to auscultation bilaterally, normal work of breathing GI: soft, nontender, nondistended, + BS MS: no deformity or atrophy  Skin: warm and dry, no rash Neuro:   Alert and Oriented x 3, Strength and sensation are intact, follows commands Psych: euthymic mood, full affect  Wt Readings from Last 3 Encounters:  02/02/17 190 lb (86.2 kg)  09/02/16 214 lb 15.2 oz (97.5 kg)      Studies/Labs Reviewed:   EKG:  EKG was ordered today and personally reviewed by me and demonstrates *** EKG was not ordered today.***  Recent Labs: 08/30/2016: B Natriuretic Peptide 191.8; TSH 1.323 02/05/2017: Magnesium 1.0 02/06/2017: ALT 13; BUN 11; Creatinine, Ser 1.32; Hemoglobin 8.3; Platelets 255; Potassium 3.3; Sodium 140   Lipid Panel    Component Value Date/Time   CHOL 181 02/03/2017 0218   TRIG 165 (H) 02/03/2017 0218   HDL 27 (L) 02/03/2017 0218   CHOLHDL 6.7 02/03/2017 0218   VLDL 33 02/03/2017 0218   LDLCALC 121 (H) 02/03/2017 0218    Additional studies/ records that were reviewed today include: Summarized above, including: Echocardiogram 02/04/17: Study Conclusions - Left ventricle: The cavity size was normal. Wall thickness was increased in a pattern of mild LVH. Systolic function was normal. The estimated ejection fraction was in the range of 60% to 65%. Left ventricular diastolic function parameters were normal. - Aortic valve: There was mild stenosis. Valve area (VTI): 1.81 cm^2. Valve area (Vmax): 1.52 cm^2. Valve area (Vmean): 1.41 cm^2. - Left atrium: The atrium was mildly dilated. - Atrial septum: No defect or patent foramen ovale was identified. - Pulmonary arteries: PA peak pressure: 39 mm Hg (S).    ASSESSMENT & PLAN:   1. ***  Disposition: F/u with ***   Medication Adjustments/Labs and Tests Ordered: Current medicines are reviewed at length with the patient today.  Concerns regarding medicines are outlined above. Medication changes, Labs and Tests ordered today are summarized above and listed in the Patient Instructions accessible in Encounters.   Signed, Laurann Montanaayna N Dunn, PA-C  02/26/2017 9:43 AM    [facility] [address], [place], [place]  [address] Phone: [phone]; Fax: [phone]

## 2017-03-03 DIAGNOSIS — J9601 Acute respiratory failure with hypoxia: Secondary | ICD-10-CM | POA: Diagnosis not present

## 2017-03-03 DIAGNOSIS — R0602 Shortness of breath: Secondary | ICD-10-CM | POA: Diagnosis not present

## 2017-03-03 DIAGNOSIS — I5033 Acute on chronic diastolic (congestive) heart failure: Secondary | ICD-10-CM | POA: Diagnosis not present

## 2017-03-03 DIAGNOSIS — J690 Pneumonitis due to inhalation of food and vomit: Secondary | ICD-10-CM | POA: Diagnosis not present

## 2017-03-06 ENCOUNTER — Encounter: Payer: Self-pay | Admitting: Physician Assistant

## 2017-03-28 DEATH — deceased

## 2017-12-10 IMAGING — CR DG CHEST 1V PORT
1 series · 1 of 1 positions shown · non-contrast
Comparison: 02/02/2017 radiographs

CLINICAL DATA: Congestive heart failure.

EXAM:
PORTABLE CHEST 1 VIEW

[AP]
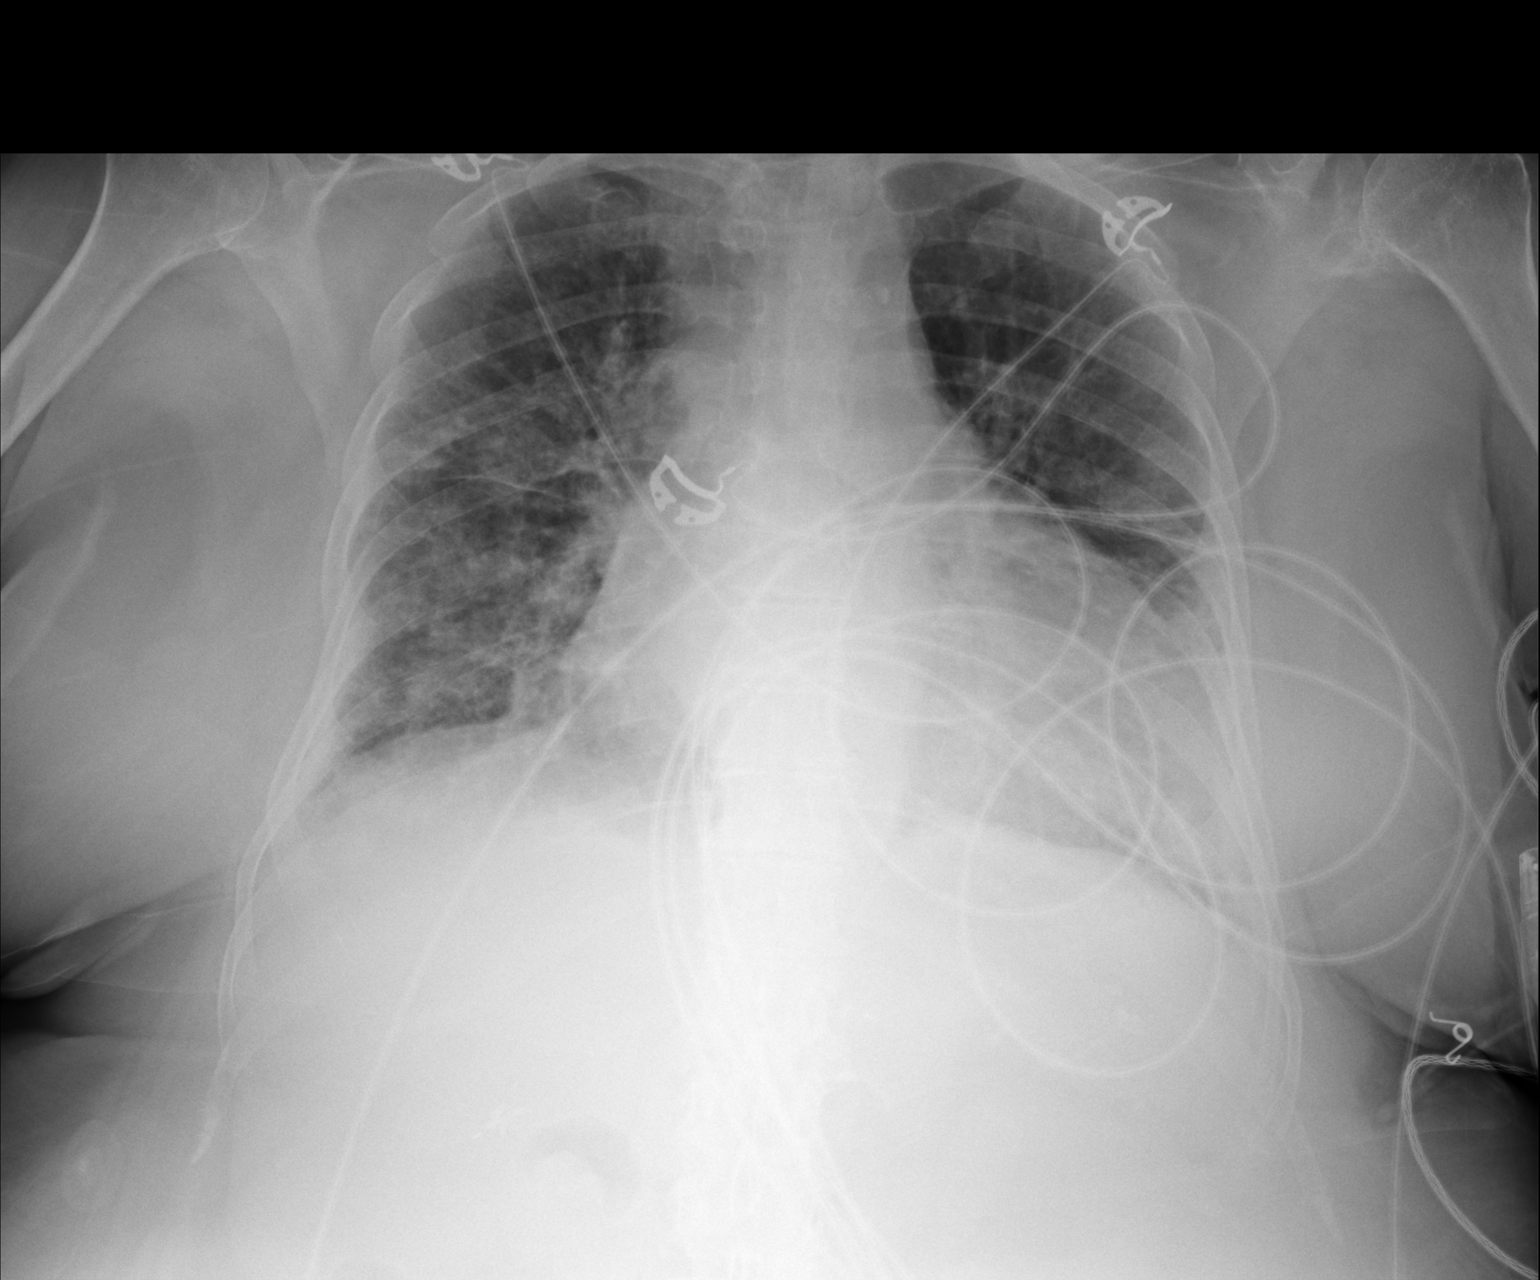

[1 of 1 positions shown; findings below may reference images not displayed]

FINDINGS: Cardiomegaly and bilateral airspace opacities and trace bilateral
pleural effusions again noted.

There is no evidence of pneumothorax.

There has been little interval change since the prior study.
IMPRESSION: Unchanged appearance of the chest with bilateral airspace opacities
and trace bilateral pleural effusions.
